# Patient Record
Sex: Male | Born: 1975 | Race: White | Hispanic: No | Marital: Married | Smoking: Never smoker
Health system: Southern US, Community
[De-identification: ages and names within clinical notes are randomized; demographics above are authoritative.]

## PROBLEM LIST (undated history)

## (undated) DIAGNOSIS — I447 Left bundle-branch block, unspecified: Secondary | ICD-10-CM

## (undated) DIAGNOSIS — I493 Ventricular premature depolarization: Secondary | ICD-10-CM

## (undated) DIAGNOSIS — E785 Hyperlipidemia, unspecified: Secondary | ICD-10-CM

## (undated) DIAGNOSIS — R7303 Prediabetes: Secondary | ICD-10-CM

## (undated) DIAGNOSIS — I10 Essential (primary) hypertension: Secondary | ICD-10-CM

## (undated) DIAGNOSIS — T7840XA Allergy, unspecified, initial encounter: Secondary | ICD-10-CM

## (undated) DIAGNOSIS — K219 Gastro-esophageal reflux disease without esophagitis: Secondary | ICD-10-CM

## (undated) DIAGNOSIS — J45909 Unspecified asthma, uncomplicated: Secondary | ICD-10-CM

## (undated) DIAGNOSIS — M87059 Idiopathic aseptic necrosis of unspecified femur: Secondary | ICD-10-CM

## (undated) DIAGNOSIS — F32A Depression, unspecified: Secondary | ICD-10-CM

## (undated) HISTORY — DX: Essential (primary) hypertension: I10

## (undated) HISTORY — PX: KNEE ARTHROSCOPY: SUR90

## (undated) HISTORY — DX: Left bundle-branch block, unspecified: I44.7

## (undated) HISTORY — DX: Hyperlipidemia, unspecified: E78.5

## (undated) HISTORY — DX: Prediabetes: R73.03

## (undated) HISTORY — DX: Depression, unspecified: F32.A

## (undated) HISTORY — DX: Allergy, unspecified, initial encounter: T78.40XA

## (undated) HISTORY — DX: Ventricular premature depolarization: I49.3

---

## 2003-10-04 ENCOUNTER — Emergency Department (HOSPITAL_COMMUNITY): Admission: EM | Admit: 2003-10-04 | Discharge: 2003-10-04 | Payer: Self-pay | Admitting: Emergency Medicine

## 2005-04-24 ENCOUNTER — Emergency Department (HOSPITAL_COMMUNITY): Admission: EM | Admit: 2005-04-24 | Discharge: 2005-04-24 | Payer: Self-pay | Admitting: Family Medicine

## 2005-04-27 ENCOUNTER — Emergency Department (HOSPITAL_COMMUNITY): Admission: EM | Admit: 2005-04-27 | Discharge: 2005-04-27 | Payer: Self-pay | Admitting: *Deleted

## 2010-11-14 ENCOUNTER — Emergency Department (HOSPITAL_COMMUNITY)
Admission: EM | Admit: 2010-11-14 | Discharge: 2010-11-14 | Disposition: A | Discharge status: Home / Self Care | Payer: Commercial Managed Care - PPO | Attending: Emergency Medicine | Admitting: Emergency Medicine

## 2010-11-14 ENCOUNTER — Emergency Department (HOSPITAL_COMMUNITY): Payer: Commercial Managed Care - PPO

## 2010-11-14 DIAGNOSIS — R269 Unspecified abnormalities of gait and mobility: Secondary | ICD-10-CM | POA: Insufficient documentation

## 2010-11-14 DIAGNOSIS — R51 Headache: Secondary | ICD-10-CM | POA: Insufficient documentation

## 2010-11-14 DIAGNOSIS — R110 Nausea: Secondary | ICD-10-CM | POA: Insufficient documentation

## 2010-11-14 DIAGNOSIS — R42 Dizziness and giddiness: Secondary | ICD-10-CM | POA: Insufficient documentation

## 2010-11-14 LAB — POCT I-STAT, CHEM 8
BUN: 17 mg/dL (ref 6–23)
Calcium, Ion: 1.21 mmol/L (ref 1.12–1.32)
Creatinine, Ser: 1 mg/dL (ref 0.50–1.35)
Glucose, Bld: 97 mg/dL (ref 70–99)
HCT: 51 % (ref 39.0–52.0)
Hemoglobin: 17.3 g/dL — ABNORMAL HIGH (ref 13.0–17.0)
Sodium: 139 mEq/L (ref 135–145)
TCO2: 23 mmol/L (ref 0–100)

## 2013-09-22 ENCOUNTER — Other Ambulatory Visit: Payer: Self-pay | Admitting: Physical Medicine and Rehabilitation

## 2013-09-22 DIAGNOSIS — M545 Low back pain, unspecified: Secondary | ICD-10-CM

## 2013-09-22 DIAGNOSIS — M5126 Other intervertebral disc displacement, lumbar region: Secondary | ICD-10-CM

## 2013-09-22 DIAGNOSIS — IMO0002 Reserved for concepts with insufficient information to code with codable children: Secondary | ICD-10-CM

## 2013-09-22 DIAGNOSIS — M5137 Other intervertebral disc degeneration, lumbosacral region: Secondary | ICD-10-CM

## 2013-09-27 ENCOUNTER — Ambulatory Visit
Admission: RE | Admit: 2013-09-27 | Discharge: 2013-09-27 | Disposition: A | Discharge status: Home / Self Care | Payer: Commercial Managed Care - PPO | Source: Ambulatory Visit | Attending: Physical Medicine and Rehabilitation | Admitting: Physical Medicine and Rehabilitation

## 2013-09-27 DIAGNOSIS — IMO0002 Reserved for concepts with insufficient information to code with codable children: Secondary | ICD-10-CM

## 2013-09-27 DIAGNOSIS — M545 Low back pain, unspecified: Secondary | ICD-10-CM

## 2013-09-27 DIAGNOSIS — M5137 Other intervertebral disc degeneration, lumbosacral region: Secondary | ICD-10-CM

## 2013-09-27 DIAGNOSIS — M5126 Other intervertebral disc displacement, lumbar region: Secondary | ICD-10-CM

## 2015-01-12 ENCOUNTER — Other Ambulatory Visit: Payer: Self-pay | Admitting: Family Medicine

## 2015-01-12 DIAGNOSIS — M519 Unspecified thoracic, thoracolumbar and lumbosacral intervertebral disc disorder: Secondary | ICD-10-CM

## 2015-01-12 DIAGNOSIS — M47816 Spondylosis without myelopathy or radiculopathy, lumbar region: Secondary | ICD-10-CM

## 2015-01-16 ENCOUNTER — Ambulatory Visit
Admission: RE | Admit: 2015-01-16 | Discharge: 2015-01-16 | Disposition: A | Discharge status: Home / Self Care | Payer: Commercial Managed Care - PPO | Source: Ambulatory Visit | Attending: Family Medicine | Admitting: Family Medicine

## 2015-01-16 DIAGNOSIS — M47816 Spondylosis without myelopathy or radiculopathy, lumbar region: Secondary | ICD-10-CM

## 2015-01-16 DIAGNOSIS — M519 Unspecified thoracic, thoracolumbar and lumbosacral intervertebral disc disorder: Secondary | ICD-10-CM

## 2015-02-08 ENCOUNTER — Other Ambulatory Visit: Payer: Self-pay | Admitting: Orthopaedic Surgery

## 2015-02-08 DIAGNOSIS — M25551 Pain in right hip: Secondary | ICD-10-CM

## 2015-02-17 ENCOUNTER — Ambulatory Visit
Admission: RE | Admit: 2015-02-17 | Discharge: 2015-02-17 | Disposition: A | Discharge status: Home / Self Care | Payer: BLUE CROSS/BLUE SHIELD | Source: Ambulatory Visit | Attending: Orthopaedic Surgery | Admitting: Orthopaedic Surgery

## 2015-02-17 DIAGNOSIS — M25551 Pain in right hip: Secondary | ICD-10-CM

## 2015-03-22 NOTE — Patient Instructions (Addendum)
Jacob Faulkner  03/22/2015   Your procedure is scheduled ON:[facility] 04/01/2015   Report to [facility] Main  Entrance take [place]  elevators to 3rd floor to  Short Stay Center at   1040  AM.  Call this number if you have problems the morning of surgery [phone]   Remember: ONLY 1 PERSON MAY GO WITH YOU TO SHORT STAY TO GET  READY MORNING OF YOUR SURGERY.   Do not eat food or drink liquids :After Midnight.     Take these medicines the morning of surgery with A SIP OF WATER: use Albuterol inhaler if needed and bring with you morning of surgery                                 You may not have any metal on your body including hair pins and              piercings  Do not wear jewelry, make-up, lotions, powders or perfumes, deodorant             Do not wear nail polish.  Do not shave  48 hours prior to surgery.              Men may shave face and neck.   Do not bring valuables to the hospital. [facility] IS NOT             RESPONSIBLE   FOR VALUABLES.  Contacts, dentures or bridgework may not be worn into surgery.  Leave suitcase in the car. After surgery it may be brought to your room.     Patients discharged the day of surgery will not be allowed to drive home.  Name and phone number of your driver:  Special Instructions: N/A              Please read over the following fact sheets you were given: _____________________________________________________________________             [facility] - Preparing for Surgery Before surgery, you can play an important role.  Because skin is not sterile, your skin needs to be as free of germs as possible.  You can reduce the number of germs on your skin by washing with CHG (chlorahexidine gluconate) soap before surgery.  CHG is an antiseptic cleaner which kills germs and bonds with the skin to continue killing germs even after washing. Please DO NOT use if you have an allergy to CHG or antibacterial soaps.  If your skin  becomes reddened/irritated stop using the CHG and inform your nurse when you arrive at Short Stay. Do not shave (including legs and underarms) for at least 48 hours prior to the first CHG shower.  You may shave your face/neck. Please follow these instructions carefully:  1.  Shower with CHG Soap the night before surgery and the  morning of Surgery.  2.  If you choose to wash your hair, wash your hair first as usual with your  normal  shampoo.  3.  After you shampoo, rinse your hair and body thoroughly to remove the  shampoo.                           4.  Use CHG as you would any other liquid soap.  You can apply chg directly  to the skin and wash                       Gently with a scrungie or clean washcloth.  5.  Apply the CHG Soap to your body ONLY FROM THE NECK DOWN.   Do not use on face/ open                           Wound or open sores. Avoid contact with eyes, ears mouth and genitals (private parts).                       Wash face,  Genitals (private parts) with your normal soap.             6.  Wash thoroughly, paying special attention to the area where your surgery  will be performed.  7.  Thoroughly rinse your body with warm water from the neck down.  8.  DO NOT shower/wash with your normal soap after using and rinsing off  the CHG Soap.                9.  Pat yourself dry with a clean towel.            10.  Wear clean pajamas.            11.  Place clean sheets on your bed the night of your first shower and do not  sleep with pets. Day of Surgery : Do not apply any lotions/deodorants the morning of surgery.  Please wear clean clothes to the hospital/surgery center.  FAILURE TO FOLLOW THESE INSTRUCTIONS MAY RESULT IN THE CANCELLATION OF YOUR SURGERY PATIENT SIGNATURE_________________________________  NURSE SIGNATURE__________________________________  ________________________________________________________________________   Jacob Faulkner  An incentive spirometer is a  tool that can help keep your lungs clear and active. This tool measures how well you are filling your lungs with each breath. Taking long deep breaths may help reverse or decrease the chance of developing breathing (pulmonary) problems (especially infection) following:  A long period of time when you are unable to move or be active. BEFORE THE PROCEDURE   If the spirometer includes an indicator to show your best effort, your nurse or respiratory therapist will set it to a desired goal.  If possible, sit up straight or lean slightly forward. Try not to slouch.  Hold the incentive spirometer in an upright position. INSTRUCTIONS FOR USE   Sit on the edge of your bed if possible, or sit up as far as you can in bed or on a chair.  Hold the incentive spirometer in an upright position.  Breathe out normally.  Place the mouthpiece in your mouth and seal your lips tightly around it.  Breathe in slowly and as deeply as possible, raising the piston or the ball toward the top of the column.  Hold your breath for 3-5 seconds or for as long as possible. Allow the piston or ball to fall to the bottom of the column.  Remove the mouthpiece from your mouth and breathe out normally.  Rest for a few seconds and repeat Steps 1 through 7 at least 10 times every 1-2 hours when you are awake. Take your time and take a few normal breaths between deep breaths.  The spirometer may include an indicator to show your best effort. Use the indicator as a goal to work toward during each repetition.  After each  set of 10 deep breaths, practice coughing to be sure your lungs are clear. If you have an incision (the cut made at the time of surgery), support your incision when coughing by placing a pillow or rolled up towels firmly against it. Once you are able to get out of bed, walk around indoors and cough well. You may stop using the incentive spirometer when instructed by your caregiver.  RISKS AND  COMPLICATIONS  Take your time so you do not get dizzy or light-headed.  If you are in pain, you may need to take or ask for pain medication before doing incentive spirometry. It is harder to take a deep breath if you are having pain. AFTER USE  Rest and breathe slowly and easily.  It can be helpful to keep track of a log of your progress. Your caregiver can provide you with a simple table to help with this. If you are using the spirometer at home, follow these instructions: [place] IF:   You are having difficultly using the spirometer.  You have trouble using the spirometer as often as instructed.  Your pain medication is not giving enough relief while using the spirometer.  You develop fever of 100.5 F (38.1 C) or higher. SEEK IMMEDIATE MEDICAL CARE IF:   You cough up bloody sputum that had not been present before.  You develop fever of 102 F (38.9 C) or greater.  You develop worsening pain at or near the incision site. MAKE SURE YOU:   Understand these instructions.  Will watch your condition.  Will get help right away if you are not doing well or get worse. Document Released: 07/02/2006 Document Revised: 05/14/2011 Document Reviewed: 09/02/2006 ExitCare Patient Information 2014 ExitCare, [place].   ________________________________________________________________________  WHAT IS A BLOOD TRANSFUSION? Blood Transfusion Information  A transfusion is the replacement of blood or some of its parts. Blood is made up of multiple cells which provide different functions.  Red blood cells carry oxygen and are used for blood loss replacement.  White blood cells fight against infection.  Platelets control bleeding.  Plasma helps clot blood.  Other blood products are available for specialized needs, such as hemophilia or other clotting disorders. BEFORE THE TRANSFUSION  Who gives blood for transfusions?   Healthy volunteers who are fully evaluated to make sure  their blood is safe. This is blood bank blood. Transfusion therapy is the safest it has ever been in the practice of medicine. Before blood is taken from a donor, a complete history is taken to make sure that person has no history of diseases nor engages in risky social behavior (examples are intravenous drug use or sexual activity with multiple partners). The donor's travel history is screened to minimize risk of transmitting infections, such as malaria. The donated blood is tested for signs of infectious diseases, such as HIV and hepatitis. The blood is then tested to be sure it is compatible with you in order to minimize the chance of a transfusion reaction. If you or a relative donates blood, this is often done in anticipation of surgery and is not appropriate for emergency situations. It takes many days to process the donated blood. RISKS AND COMPLICATIONS Although transfusion therapy is very safe and saves many lives, the main dangers of transfusion include:   Getting an infectious disease.  Developing a transfusion reaction. This is an allergic reaction to something in the blood you were given. Every precaution is taken to prevent this. The decision to have  a blood transfusion has been considered carefully by your caregiver before blood is given. Blood is not given unless the benefits outweigh the risks. AFTER THE TRANSFUSION  Right after receiving a blood transfusion, you will usually feel much better and more energetic. This is especially true if your red blood cells have gotten low (anemic). The transfusion raises the level of the red blood cells which carry oxygen, and this usually causes an energy increase.  The nurse administering the transfusion will monitor you carefully for complications. HOME CARE INSTRUCTIONS  No special instructions are needed after a transfusion. You may find your energy is better. Speak with your caregiver about any limitations on activity for underlying diseases  you may have. SEEK MEDICAL CARE IF:   Your condition is not improving after your transfusion.  You develop redness or irritation at the intravenous (IV) site. SEEK IMMEDIATE MEDICAL CARE IF:  Any of the following symptoms occur over the next 12 hours:  Shaking chills.  You have a temperature by mouth above 102 F (38.9 C), not controlled by medicine.  Chest, back, or muscle pain.  People around you feel you are not acting correctly or are confused.  Shortness of breath or difficulty breathing.  Dizziness and fainting.  You get a rash or develop hives.  You have a decrease in urine output.  Your urine turns a dark color or changes to pink, red, or brown. Any of the following symptoms occur over the next 10 days:  You have a temperature by mouth above 102 F (38.9 C), not controlled by medicine.  Shortness of breath.  Weakness after normal activity.  The white part of the eye turns yellow (jaundice).  You have a decrease in the amount of urine or are urinating less often.  Your urine turns a dark color or changes to pink, red, or brown. Document Released: 02/17/2000 Document Revised: 05/14/2011 Document Reviewed: 10/06/2007 [facility] Patient Information 2014 [place], [place].  _______________________________________________________________________

## 2015-03-23 ENCOUNTER — Other Ambulatory Visit (HOSPITAL_COMMUNITY): Payer: Self-pay | Admitting: Orthopaedic Surgery

## 2015-03-25 ENCOUNTER — Encounter (HOSPITAL_COMMUNITY)
Admission: RE | Admit: 2015-03-25 | Discharge: 2015-03-25 | Disposition: A | Discharge status: Home / Self Care | Payer: BLUE CROSS/BLUE SHIELD | Source: Ambulatory Visit | Attending: Orthopaedic Surgery | Admitting: Orthopaedic Surgery

## 2015-03-25 ENCOUNTER — Encounter (HOSPITAL_COMMUNITY): Payer: Self-pay

## 2015-03-25 DIAGNOSIS — Z0183 Encounter for blood typing: Secondary | ICD-10-CM | POA: Diagnosis not present

## 2015-03-25 DIAGNOSIS — Z01812 Encounter for preprocedural laboratory examination: Secondary | ICD-10-CM | POA: Insufficient documentation

## 2015-03-25 DIAGNOSIS — M879 Osteonecrosis, unspecified: Secondary | ICD-10-CM | POA: Diagnosis not present

## 2015-03-25 HISTORY — DX: Gastro-esophageal reflux disease without esophagitis: K21.9

## 2015-03-25 HISTORY — DX: Idiopathic aseptic necrosis of unspecified femur: M87.059

## 2015-03-25 HISTORY — DX: Unspecified asthma, uncomplicated: J45.909

## 2015-03-25 LAB — BASIC METABOLIC PANEL
ANION GAP: 8 (ref 5–15)
BUN: 17 mg/dL (ref 6–20)
CHLORIDE: 108 mmol/L (ref 101–111)
CO2: 25 mmol/L (ref 22–32)
Calcium: 9.5 mg/dL (ref 8.9–10.3)
Creatinine, Ser: 0.98 mg/dL (ref 0.61–1.24)
GFR calc Af Amer: >60 mL/min (ref >60)
GLUCOSE: 89 mg/dL (ref 65–99)
POTASSIUM: 4.2 mmol/L (ref 3.5–5.1)
SODIUM: 141 mmol/L (ref 135–145)

## 2015-03-25 LAB — SURGICAL PCR SCREEN
MRSA, PCR: NEGATIVE
Staphylococcus aureus: POSITIVE — AB

## 2015-03-25 LAB — CBC
HEMATOCRIT: 45.4 % (ref 39.0–52.0)
HEMOGLOBIN: 15.1 g/dL (ref 13.0–17.0)
MCH: 28.9 pg (ref 26.0–34.0)
MCHC: 33.3 g/dL (ref 30.0–36.0)
MCV: 86.8 fL (ref 78.0–100.0)
Platelets: 178 10*3/uL (ref 150–400)
RBC: 5.23 MIL/uL (ref 4.22–5.81)
RDW: 12.8 % (ref 11.5–15.5)
WBC: 4.6 10*3/uL (ref 4.0–10.5)

## 2015-03-25 LAB — ABO/RH: ABO/RH(D): A POS

## 2015-04-01 ENCOUNTER — Encounter (HOSPITAL_COMMUNITY): Payer: Self-pay | Admitting: *Deleted

## 2015-04-01 ENCOUNTER — Inpatient Hospital Stay (HOSPITAL_COMMUNITY): Payer: BLUE CROSS/BLUE SHIELD | Admitting: Anesthesiology

## 2015-04-01 ENCOUNTER — Inpatient Hospital Stay (HOSPITAL_COMMUNITY): Payer: BLUE CROSS/BLUE SHIELD

## 2015-04-01 ENCOUNTER — Encounter (HOSPITAL_COMMUNITY)
Admission: RE | Disposition: A | Discharge status: Home Health | Payer: Self-pay | Source: Ambulatory Visit | Attending: Orthopaedic Surgery

## 2015-04-01 ENCOUNTER — Inpatient Hospital Stay (HOSPITAL_COMMUNITY)
Admission: RE | Admit: 2015-04-01 | Discharge: 2015-04-02 | DRG: 470 | Disposition: A | Discharge status: Home Health | Payer: BLUE CROSS/BLUE SHIELD | Source: Ambulatory Visit | Attending: Orthopaedic Surgery | Admitting: Orthopaedic Surgery

## 2015-04-01 DIAGNOSIS — Z87891 Personal history of nicotine dependence: Secondary | ICD-10-CM | POA: Diagnosis not present

## 2015-04-01 DIAGNOSIS — Z01812 Encounter for preprocedural laboratory examination: Secondary | ICD-10-CM | POA: Diagnosis not present

## 2015-04-01 DIAGNOSIS — M25551 Pain in right hip: Secondary | ICD-10-CM | POA: Diagnosis present

## 2015-04-01 DIAGNOSIS — Z419 Encounter for procedure for purposes other than remedying health state, unspecified: Secondary | ICD-10-CM

## 2015-04-01 DIAGNOSIS — Z96641 Presence of right artificial hip joint: Secondary | ICD-10-CM

## 2015-04-01 DIAGNOSIS — M87051 Idiopathic aseptic necrosis of right femur: Secondary | ICD-10-CM | POA: Diagnosis present

## 2015-04-01 DIAGNOSIS — K219 Gastro-esophageal reflux disease without esophagitis: Secondary | ICD-10-CM | POA: Diagnosis present

## 2015-04-01 HISTORY — PX: TOTAL HIP ARTHROPLASTY: SHX124

## 2015-04-01 LAB — TYPE AND SCREEN
ABO/RH(D): A POS
ANTIBODY SCREEN: NEGATIVE

## 2015-04-01 SURGERY — ARTHROPLASTY, HIP, TOTAL, ANTERIOR APPROACH
Anesthesia: General | Site: Hip | Laterality: Right

## 2015-04-01 MED ORDER — FENTANYL CITRATE (PF) 100 MCG/2ML IJ SOLN
INTRAMUSCULAR | Status: DC | PRN
  Administered 2015-04-01: 100 ug via INTRAVENOUS

## 2015-04-01 MED ORDER — METHOCARBAMOL 1000 MG/10ML IJ SOLN
500.0000 mg | Freq: Four times a day (QID) | INTRAMUSCULAR | Status: DC | PRN
  Filled 2015-04-01: qty 5

## 2015-04-01 MED ORDER — CLINDAMYCIN PHOSPHATE 600 MG/50ML IV SOLN
600.0000 mg | Freq: Four times a day (QID) | INTRAVENOUS | Status: AC
  Administered 2015-04-01 – 2015-04-02 (×2): 600 mg via INTRAVENOUS
  Filled 2015-04-01 (×2): qty 50

## 2015-04-01 MED ORDER — DOCUSATE SODIUM 100 MG PO CAPS
100.0000 mg | ORAL_CAPSULE | Freq: Two times a day (BID) | ORAL | Status: DC
  Administered 2015-04-01 – 2015-04-02 (×2): 100 mg via ORAL

## 2015-04-01 MED ORDER — FENTANYL CITRATE (PF) 100 MCG/2ML IJ SOLN
25.0000 ug | INTRAMUSCULAR | Status: DC | PRN
  Administered 2015-04-01 (×2): 50 ug via INTRAVENOUS

## 2015-04-01 MED ORDER — DEXAMETHASONE SODIUM PHOSPHATE 10 MG/ML IJ SOLN
INTRAMUSCULAR | Status: DC | PRN
  Administered 2015-04-01: 10 mg via INTRAVENOUS

## 2015-04-01 MED ORDER — TRANEXAMIC ACID 1000 MG/10ML IV SOLN
1000.0000 mg | INTRAVENOUS | Status: AC
  Administered 2015-04-01: 1000 mg via INTRAVENOUS
  Filled 2015-04-01: qty 10

## 2015-04-01 MED ORDER — ACETAMINOPHEN 650 MG RE SUPP: 650.0000 mg | Freq: Four times a day (QID) | RECTAL | Status: DC | PRN

## 2015-04-01 MED ORDER — HYDROMORPHONE HCL 1 MG/ML IJ SOLN
INTRAMUSCULAR | Status: DC | PRN
  Administered 2015-04-01 (×2): 0.5 mg via INTRAVENOUS
  Administered 2015-04-01: 1 mg via INTRAVENOUS

## 2015-04-01 MED ORDER — ONDANSETRON HCL 4 MG/2ML IJ SOLN: 4.0000 mg | Freq: Four times a day (QID) | INTRAMUSCULAR | Status: DC | PRN

## 2015-04-01 MED ORDER — ONDANSETRON HCL 4 MG/2ML IJ SOLN
INTRAMUSCULAR | Status: DC | PRN
  Administered 2015-04-01: 4 mg via INTRAVENOUS

## 2015-04-01 MED ORDER — HYDROMORPHONE HCL 2 MG/ML IJ SOLN
INTRAMUSCULAR | Status: AC
  Filled 2015-04-01: qty 1

## 2015-04-01 MED ORDER — LACTATED RINGERS IV SOLN
INTRAVENOUS | Status: DC
  Administered 2015-04-01: 1000 mL via INTRAVENOUS
  Administered 2015-04-01: 14:00:00 via INTRAVENOUS

## 2015-04-01 MED ORDER — PANTOPRAZOLE SODIUM 40 MG PO TBEC
40.0000 mg | DELAYED_RELEASE_TABLET | Freq: Every day | ORAL | Status: DC
  Administered 2015-04-01 – 2015-04-02 (×2): 40 mg via ORAL
  Filled 2015-04-01 (×2): qty 1

## 2015-04-01 MED ORDER — METOCLOPRAMIDE HCL 10 MG PO TABS: 5.0000 mg | ORAL_TABLET | Freq: Three times a day (TID) | ORAL | Status: DC | PRN

## 2015-04-01 MED ORDER — STERILE WATER FOR IRRIGATION IR SOLN
Status: DC | PRN
  Administered 2015-04-01: 1000 mL

## 2015-04-01 MED ORDER — OXYCODONE HCL 5 MG PO TABS
5.0000 mg | ORAL_TABLET | ORAL | Status: DC | PRN
  Administered 2015-04-01 – 2015-04-02 (×3): 5 mg via ORAL
  Filled 2015-04-01: qty 1
  Filled 2015-04-01: qty 2
  Filled 2015-04-01: qty 1

## 2015-04-01 MED ORDER — KETOROLAC TROMETHAMINE 15 MG/ML IJ SOLN
7.5000 mg | Freq: Four times a day (QID) | INTRAMUSCULAR | Status: AC
  Administered 2015-04-01 – 2015-04-02 (×4): 7.5 mg via INTRAVENOUS
  Filled 2015-04-01 (×4): qty 1

## 2015-04-01 MED ORDER — ALBUTEROL SULFATE (2.5 MG/3ML) 0.083% IN NEBU: 3.0000 mL | INHALATION_SOLUTION | Freq: Four times a day (QID) | RESPIRATORY_TRACT | Status: DC | PRN

## 2015-04-01 MED ORDER — SODIUM CHLORIDE 0.9 % IV SOLN
INTRAVENOUS | Status: DC
  Administered 2015-04-01: 16:00:00 via INTRAVENOUS

## 2015-04-01 MED ORDER — LIDOCAINE HCL (CARDIAC) 20 MG/ML IV SOLN
INTRAVENOUS | Status: DC | PRN
  Administered 2015-04-01: 50 mg via INTRAVENOUS

## 2015-04-01 MED ORDER — PHENOL 1.4 % MT LIQD
1.0000 | OROMUCOSAL | Status: DC | PRN
  Filled 2015-04-01: qty 177

## 2015-04-01 MED ORDER — METHOCARBAMOL 500 MG PO TABS: 500.0000 mg | ORAL_TABLET | Freq: Four times a day (QID) | ORAL | Status: DC | PRN

## 2015-04-01 MED ORDER — MENTHOL 3 MG MT LOZG: 1.0000 | LOZENGE | OROMUCOSAL | Status: DC | PRN

## 2015-04-01 MED ORDER — CLINDAMYCIN PHOSPHATE 900 MG/50ML IV SOLN
INTRAVENOUS | Status: AC
  Filled 2015-04-01: qty 50

## 2015-04-01 MED ORDER — DEXAMETHASONE SODIUM PHOSPHATE 10 MG/ML IJ SOLN
INTRAMUSCULAR | Status: AC
  Filled 2015-04-01: qty 1

## 2015-04-01 MED ORDER — FENTANYL CITRATE (PF) 100 MCG/2ML IJ SOLN
INTRAMUSCULAR | Status: AC
  Filled 2015-04-01: qty 2

## 2015-04-01 MED ORDER — DIPHENHYDRAMINE HCL 12.5 MG/5ML PO ELIX: 12.5000 mg | ORAL_SOLUTION | ORAL | Status: DC | PRN

## 2015-04-01 MED ORDER — CLINDAMYCIN PHOSPHATE 900 MG/50ML IV SOLN
900.0000 mg | INTRAVENOUS | Status: AC
  Administered 2015-04-01: 900 mg via INTRAVENOUS

## 2015-04-01 MED ORDER — METOCLOPRAMIDE HCL 5 MG/ML IJ SOLN: 5.0000 mg | Freq: Three times a day (TID) | INTRAMUSCULAR | Status: DC | PRN

## 2015-04-01 MED ORDER — SUGAMMADEX SODIUM 200 MG/2ML IV SOLN
INTRAVENOUS | Status: AC
  Filled 2015-04-01: qty 2

## 2015-04-01 MED ORDER — ONDANSETRON HCL 4 MG/2ML IJ SOLN: 4.0000 mg | Freq: Once | INTRAMUSCULAR | Status: DC | PRN

## 2015-04-01 MED ORDER — ALUM & MAG HYDROXIDE-SIMETH 200-200-20 MG/5ML PO SUSP: 30.0000 mL | ORAL | Status: DC | PRN

## 2015-04-01 MED ORDER — SUGAMMADEX SODIUM 200 MG/2ML IV SOLN
INTRAVENOUS | Status: DC | PRN
  Administered 2015-04-01: 200 mg via INTRAVENOUS

## 2015-04-01 MED ORDER — MIDAZOLAM HCL 2 MG/2ML IJ SOLN
INTRAMUSCULAR | Status: AC
  Filled 2015-04-01: qty 2

## 2015-04-01 MED ORDER — PROPOFOL 10 MG/ML IV BOLUS
INTRAVENOUS | Status: AC
  Filled 2015-04-01: qty 20

## 2015-04-01 MED ORDER — LIDOCAINE HCL (CARDIAC) 20 MG/ML IV SOLN
INTRAVENOUS | Status: AC
  Filled 2015-04-01: qty 5

## 2015-04-01 MED ORDER — ONDANSETRON HCL 4 MG PO TABS: 4.0000 mg | ORAL_TABLET | Freq: Four times a day (QID) | ORAL | Status: DC | PRN

## 2015-04-01 MED ORDER — ROCURONIUM BROMIDE 100 MG/10ML IV SOLN
INTRAVENOUS | Status: DC | PRN
Start: 2015-04-01 — End: 2015-04-01
  Administered 2015-04-01: 60 mg via INTRAVENOUS
  Administered 2015-04-01 (×2): 10 mg via INTRAVENOUS

## 2015-04-01 MED ORDER — ACETAMINOPHEN 325 MG PO TABS: 650.0000 mg | ORAL_TABLET | Freq: Four times a day (QID) | ORAL | Status: DC | PRN

## 2015-04-01 MED ORDER — ASPIRIN EC 325 MG PO TBEC
325.0000 mg | DELAYED_RELEASE_TABLET | Freq: Two times a day (BID) | ORAL | Status: DC
  Administered 2015-04-02 (×2): 325 mg via ORAL
  Filled 2015-04-01 (×3): qty 1

## 2015-04-01 MED ORDER — ONDANSETRON HCL 4 MG/2ML IJ SOLN
INTRAMUSCULAR | Status: AC
  Filled 2015-04-01: qty 2

## 2015-04-01 MED ORDER — 0.9 % SODIUM CHLORIDE (POUR BTL) OPTIME
TOPICAL | Status: DC | PRN
  Administered 2015-04-01: 1000 mL

## 2015-04-01 MED ORDER — BUPIVACAINE HCL (PF) 0.5 % IJ SOLN
INTRAMUSCULAR | Status: AC
  Filled 2015-04-01: qty 30

## 2015-04-01 MED ORDER — HYDROMORPHONE HCL 1 MG/ML IJ SOLN
1.0000 mg | INTRAMUSCULAR | Status: DC | PRN
  Administered 2015-04-01: 1 mg via INTRAVENOUS
  Filled 2015-04-01: qty 1

## 2015-04-01 MED ORDER — PROPOFOL 10 MG/ML IV BOLUS
INTRAVENOUS | Status: DC | PRN
  Administered 2015-04-01: 200 mg via INTRAVENOUS

## 2015-04-01 MED ORDER — SODIUM CHLORIDE 0.9 % IR SOLN
Status: DC | PRN
  Administered 2015-04-01: 1000 mL

## 2015-04-01 MED ORDER — MIDAZOLAM HCL 5 MG/5ML IJ SOLN
INTRAMUSCULAR | Status: DC | PRN
  Administered 2015-04-01: 2 mg via INTRAVENOUS

## 2015-04-01 MED ORDER — ZOLPIDEM TARTRATE 5 MG PO TABS: 5.0000 mg | ORAL_TABLET | Freq: Every evening | ORAL | Status: DC | PRN

## 2015-04-01 SURGICAL SUPPLY — 38 items
APL SKNCLS STERI-STRIP NONHPOA (GAUZE/BANDAGES/DRESSINGS) ×1
BAG SPEC THK2 15X12 ZIP CLS (MISCELLANEOUS) ×1
BAG ZIPLOCK 12X15 (MISCELLANEOUS) ×1 IMPLANT
BENZOIN TINCTURE PRP APPL 2/3 (GAUZE/BANDAGES/DRESSINGS) ×1 IMPLANT
BLADE SAW SGTL 18X1.27X75 (BLADE) ×2 IMPLANT
CAPT HIP TOTAL 2 ×1 IMPLANT
CELLS DAT CNTRL 66122 CELL SVR (MISCELLANEOUS) ×1 IMPLANT
CLOTH BEACON ORANGE TIMEOUT ST (SAFETY) ×2 IMPLANT
DRAPE STERI IOBAN 125X83 (DRAPES) ×2 IMPLANT
DRAPE U-SHAPE 47X51 STRL (DRAPES) ×4 IMPLANT
DRSG AQUACEL AG ADV 3.5X10 (GAUZE/BANDAGES/DRESSINGS) ×2 IMPLANT
DURAPREP 26ML APPLICATOR (WOUND CARE) ×2 IMPLANT
ELECT REM PT RETURN 9FT ADLT (ELECTROSURGICAL) ×2
ELECTRODE REM PT RTRN 9FT ADLT (ELECTROSURGICAL) ×1 IMPLANT
FACESHIELD WRAPAROUND (MASK) ×2 IMPLANT
FACESHIELD WRAPAROUND OR TEAM (MASK) IMPLANT
GAUZE XEROFORM 1X8 LF (GAUZE/BANDAGES/DRESSINGS) IMPLANT
GLOVE BIO SURGEON STRL SZ7.5 (GLOVE) ×2 IMPLANT
GLOVE BIOGEL PI IND STRL 8 (GLOVE) ×2 IMPLANT
GLOVE BIOGEL PI INDICATOR 8 (GLOVE) ×2
GLOVE ECLIPSE 8.0 STRL XLNG CF (GLOVE) ×2 IMPLANT
GOWN STRL REUS W/TWL XL LVL3 (GOWN DISPOSABLE) ×4 IMPLANT
HANDPIECE INTERPULSE COAX TIP (DISPOSABLE) ×2
HOLDER FOLEY CATH W/STRAP (MISCELLANEOUS) ×2 IMPLANT
PACK ANTERIOR HIP CUSTOM (KITS) ×2 IMPLANT
RETRACTOR WND ALEXIS 18 MED (MISCELLANEOUS) ×1 IMPLANT
RTRCTR WOUND ALEXIS 18CM MED (MISCELLANEOUS) ×2
SET HNDPC FAN SPRY TIP SCT (DISPOSABLE) ×1 IMPLANT
STAPLER VISISTAT 35W (STAPLE) IMPLANT
STRIP CLOSURE SKIN 1/2X4 (GAUZE/BANDAGES/DRESSINGS) ×1 IMPLANT
SUT ETHIBOND NAB CT1 #1 30IN (SUTURE) ×2 IMPLANT
SUT MNCRL AB 4-0 PS2 18 (SUTURE) IMPLANT
SUT VIC AB 0 CT1 36 (SUTURE) ×2 IMPLANT
SUT VIC AB 1 CT1 36 (SUTURE) ×2 IMPLANT
SUT VIC AB 2-0 CT1 27 (SUTURE) ×4
SUT VIC AB 2-0 CT1 TAPERPNT 27 (SUTURE) ×2 IMPLANT
TRAY FOLEY W/METER SILVER 14FR (SET/KITS/TRAYS/PACK) ×1 IMPLANT
TRAY FOLEY W/METER SILVER 16FR (SET/KITS/TRAYS/PACK) ×2 IMPLANT

## 2015-04-01 NOTE — Anesthesia Postprocedure Evaluation (Signed)
Anesthesia Post Note  Patient: Jacob Faulkner  Procedure(s) Performed: Procedure(s) (LRB): RIGHT TOTAL HIP ARTHROPLASTY ANTERIOR APPROACH (Right)  Patient location during evaluation: PACU Anesthesia Type: General Level of consciousness: awake and alert Pain management: pain level controlled Vital Signs Assessment: post-procedure vital signs reviewed and stable Respiratory status: spontaneous breathing, nonlabored ventilation, respiratory function stable and patient connected to nasal cannula oxygen Cardiovascular status: blood pressure returned to baseline and stable Postop Assessment: no signs of nausea or vomiting Anesthetic complications: no    Last Vitals:  Filed Vitals:   04/01/15 1650 04/01/15 1750  BP: 134/78 136/88  Pulse: 86 92  Temp: 36.7 C 36.8 C  Resp: 18 18    Last Pain:  Filed Vitals:   04/01/15 1751  PainSc: 3                  Kadelyn Dimascio JENNETTE

## 2015-04-01 NOTE — Brief Op Note (Signed)
04/01/2015  2:13 PM  PATIENT:  Jacob Faulkner  40 y.o. male  PRE-OPERATIVE DIAGNOSIS:  avascular necrosis right hip  POST-OPERATIVE DIAGNOSIS:  avascular necrosis right hip  PROCEDURE:  Procedure(s): RIGHT TOTAL HIP ARTHROPLASTY ANTERIOR APPROACH (Right)  SURGEON:  Surgeon(s) and Role:    * Kathryne Hitch, MD - Primary  PHYSICIAN ASSISTANT: Rexene Edison, PA-C  ANESTHESIA:   general  EBL:  Total I/O In: 1000 [I.V.:1000] Out: 450 [Urine:150; Blood:300]  BLOOD ADMINISTERED:none  SPECIMEN:  Source of Specimen:  right femoral head  DISPOSITION OF SPECIMEN:  PATHOLOGY  COUNTS:  YES  TOURNIQUET:  * No tourniquets in log *  DICTATION: .Other Dictation: Dictation Number [id number]  PLAN OF CARE: Admit to inpatient   PATIENT DISPOSITION:  PACU - hemodynamically stable.   Delay start of Pharmacological VTE agent (>24hrs) due to surgical blood loss or risk of bleeding: no

## 2015-04-01 NOTE — H&P (Signed)
TOTAL HIP ADMISSION H&P  Patient is admitted for right total hip arthroplasty.  Subjective:  Chief Complaint: right hip pain  HPI: Jacob Faulkner, 40 y.o. male, has a history of pain and functional disability in the right hip(s) due to avascualr necrosis and patient has failed non-surgical conservative treatments for greater than 12 weeks to include NSAID's and/or analgesics and activity modification.  Onset of symptoms was abrupt starting 1 years ago with rapidlly worsening course since that time.The patient noted no past surgery on the right hip(s).  Patient currently rates pain in the right hip at 10 out of 10 with activity. Patient has worsening of pain with activity and weight bearing, pain that interfers with activities of daily living and pain with passive range of motion. Patient has evidence of subchondral cysts and bone marrow edema consistent with AVN by imaging studies. This condition presents safety issues increasing the risk of falls.  There is no current active infection.  Patient Active Problem List   Diagnosis Date Noted  . Avascular necrosis of bone of right hip (HCC) 04/01/2015   Past Medical History  Diagnosis Date  . Asthma   . GERD (gastroesophageal reflux disease)   . Avascular necrosis of hip (HCC)     right hip    Past Surgical History  Procedure Laterality Date  . Knee arthroscopy      left    No prescriptions prior to admission   Allergies  Allergen Reactions  . Omnicef [Cefdinir] Hives  . Penicillins Hives    Has patient had a PCN reaction causing immediate rash, facial/tongue/throat swelling, SOB or lightheadedness with hypotension: No Has patient had a PCN reaction causing severe rash involving mucus membranes or skin necrosis: No Has patient had a PCN reaction that required hospitalization No Has patient had a PCN reaction occurring within the last 10 years: No If all of the above answers are "NO", then may proceed with Cephalosporin use.    Social  History  Substance Use Topics  . Smoking status: Former Games developer  . Smokeless tobacco: Not on file  . Alcohol Use: No    No family history on file.   Review of Systems  Musculoskeletal: Positive for joint pain.  All other systems reviewed and are negative.   Objective:  Physical Exam  Constitutional: He is oriented to person, place, and time. He appears well-developed and well-nourished.  HENT:  Head: Normocephalic and atraumatic.  Eyes: EOM are normal. Pupils are equal, round, and reactive to light.  Neck: Normal range of motion. Neck supple.  Cardiovascular: Normal rate and regular rhythm.   Respiratory: Effort normal and breath sounds normal.  GI: Soft. Bowel sounds are normal.  Musculoskeletal:       Right hip: He exhibits decreased range of motion, decreased strength, tenderness and bony tenderness.  Neurological: He is alert and oriented to person, place, and time.  Skin: Skin is warm and dry.  Psychiatric: He has a normal mood and affect.    Vital signs in last 24 hours:    Labs:   There is no height on file to calculate BMI.   Imaging Review Plain radiographs and MRI demonstrate advanced avascular necrosis of the right hip Assessment/Plan:  AVN, right hip(s)  The patient history, physical examination, clinical judgement of the provider and imaging studies are consistent with end stage avascular necrosis of the right hip(s) and total hip arthroplasty is deemed medically necessary. The treatment options including medical management, injection therapy, arthroscopy and arthroplasty were  discussed at length. The risks and benefits of total hip arthroplasty were presented and reviewed. The risks due to aseptic loosening, infection, stiffness, dislocation/subluxation,  thromboembolic complications and other imponderables were discussed.  The patient acknowledged the explanation, agreed to proceed with the plan and consent was signed. Patient is being admitted for  inpatient treatment for surgery, pain control, PT, OT, prophylactic antibiotics, VTE prophylaxis, progressive ambulation and ADL's and discharge planning.The patient is planning to be discharged home with home health services

## 2015-04-01 NOTE — Anesthesia Preprocedure Evaluation (Addendum)
Anesthesia Evaluation  Patient identified by MRN, date of birth, ID band Patient awake    Reviewed: Allergy & Precautions, NPO status , Patient's Chart, lab work & pertinent test results  History of Anesthesia Complications Negative for: history of anesthetic complications  Airway Mallampati: II  TM Distance: >3 FB Neck ROM: Full    Dental no notable dental hx. (+) Dental Advisory Given   Pulmonary asthma , former smoker,    Pulmonary exam normal breath sounds clear to auscultation       Cardiovascular negative cardio ROS Normal cardiovascular exam Rhythm:Regular Rate:Normal     Neuro/Psych negative neurological ROS  negative psych ROS   GI/Hepatic Neg liver ROS, GERD  Medicated and Controlled,  Endo/Other  negative endocrine ROS  Renal/GU negative Renal ROS  negative genitourinary   Musculoskeletal negative musculoskeletal ROS (+)   Abdominal   Peds negative pediatric ROS (+)  Hematology negative hematology ROS (+)   Anesthesia Other Findings   Reproductive/Obstetrics negative OB ROS                            Anesthesia Physical Anesthesia Plan  ASA: II  Anesthesia Plan: General   Post-op Pain Management:    Induction: Intravenous  Airway Management Planned: Oral ETT  Additional Equipment:   Intra-op Plan:   Post-operative Plan: Extubation in OR  Informed Consent: I have reviewed the patients History and Physical, chart, labs and discussed the procedure including the risks, benefits and alternatives for the proposed anesthesia with the patient or authorized representative who has indicated his/her understanding and acceptance.   Dental advisory given  Plan Discussed with: CRNA  Anesthesia Plan Comments:        Anesthesia Quick Evaluation

## 2015-04-01 NOTE — Transfer of Care (Signed)
Immediate Anesthesia Transfer of Care Note  Patient: Jacob Faulkner  Procedure(s) Performed: Procedure(s): RIGHT TOTAL HIP ARTHROPLASTY ANTERIOR APPROACH (Right)  Patient Location: PACU  Anesthesia Type:General  Level of Consciousness: awake, alert  and oriented  Airway & Oxygen Therapy: Patient Spontanous Breathing and Patient connected to face mask oxygen  Post-op Assessment: Report given to RN and Post -op Vital signs reviewed and stable  Post vital signs: Reviewed and stable  Last Vitals:  Filed Vitals:   04/01/15 1047  BP: 136/95  Pulse: 72  Temp: 36.4 C  Resp: 18    Complications: No apparent anesthesia complications

## 2015-04-01 NOTE — Op Note (Signed)
Jacob Faulkner, Jacob Faulkner                ACCOUNT NO.:  [id number]  MEDICAL RECORD NO.:  [id number]  LOCATION:  [facility]                         FACILITY:  [facility]  PHYSICIAN:  Vanita Panda. Magnus Ivan, M.D.DATE OF BIRTH:  1975-12-26  DATE OF PROCEDURE:  04/01/2015 DATE OF DISCHARGE:                              OPERATIVE REPORT   PREOPERATIVE DIAGNOSIS:  Right hip stage III avascular necrosis.  POSTOPERATIVE DIAGNOSIS:  Right hip stage III avascular necrosis.  PROCEDURE:  Right total hip arthroplasty through direct anterior approach.  IMPLANTS:  DePuy Sector Gription acetabular component size 52, size 36 +4 neutral polyethylene liner, size 13 Corail femoral component with standard offset, size 36 +1.5 ceramic hip ball.  SURGEON:  Vanita Panda. Magnus Ivan, M.D.  ASSISTANT:  Richardean Canal, PA-C.  ANESTHESIA:  General.  ANTIBIOTICS:  900 mg IV clindamycin.  BLOOD LOSS:  250 mL.  COMPLICATIONS:  None.  INDICATIONS:  Jacob Faulkner is a 40 year old gentleman, well known to me.  He had been known to me for many years now.  Over the last year, he started developing debilitating right hip pain.  He had a lot of stiffness in internal and external rotation, but normal plain films.  Due to the continued pain with loading times of activities, we obtain an MRI of his right hip and surprisingly it showed significant avascular necrosis of the femoral head.  There were no areas of collapse, but there was significant edema in the femoral head and the cystic changes.  They were subcortical and in a wide extent of the area.  At this point, his options were limited.  A cord decompression would not work at this point, that he should consider total hip replacement surgery if he got to the point where it is affecting his activities of daily living, his mobility, and quality of life.  He is also going to consider free vascularized fibular graft.  He did do some research on his own and decided that he wanted to  proceed with a total hip arthroplasty.  I explained the risk of acute blood loss anemia, nerve and vessel injury, fracture, infection, dislocation, DVT as well as generalized wear considering his young age and high level of function.  He understands our goals are decreased pain, improved mobility, and overall improved quality of life.  PROCEDURE DESCRIPTION:  After informed consent was obtained, appropriate right hip was marked.  He was brought to the operating room and general anesthesia was obtained while he was on the stretcher.  A Foley catheter was placed and then both feet had traction boots applied to them.  Next, he was placed supine on the Hana fracture table with perineal post in place and both legs inline skeletal traction devices, but no traction applied.  His right operative hip was then prepped and draped with DuraPrep and sterile drapes.  Time-out was called to identify the correct patient, correct right hip.  We then made an incision inferior and posterior to the anterior-superior iliac spine and carried this obliquely down the leg.  We dissected down the tensor fascia lata muscle.  The tensor fascia was then divided longitudinally, so we could proceed with a direct anterior approach to the  hip.  We identified and cauterized the lateral femoral circumflex vessels and then identified the hip capsule.  We cauterized the lateral femoral circumflex vessels as well.  We then put retractors around the medial and lateral femoral neck and opened up the hip capsule in L-type format finding a moderate joint effusion.  We then placed Cobra retractors within the hip capsule, made our femoral neck cut with an oscillating saw proximal to the lesser trochanter and completed this with an osteotome.  We placed a corkscrew guide in the femoral head, removed femoral heads in its entirety and found some areas of fibrocartilage.  We still passed off the table to Pathology for identifying  whether or not there is truly avascular necrosis.  We then proceeded with the anterior hip replacement.  We placed a Bent Hohmann over the medial acetabular rim and released the transverse acetabular ligament.  I cleaned the remnants of acetabular labrum from the acetabulum.  I then began reaming under direct visualization from a size 42 reamer up to a size 51 with all reamers under direct visualization, the last reamer under direct fluoroscopy, so we could obtain our depth of reaming, our inclination, and anteversion. Once we were pleased with this, I placed the real DePuy Sector Gription acetabular component size 52 and a 36 +4 polyethylene liner for that size of acetabular component.  Attention was then turned to the femur with the leg externally rotated to 100 degrees, extended and adducted. We were able to use a Mueller retractor medially and a Hohmann retractor behind the greater trochanter.  I released the lateral joint capsule, used a box cutting osteotome in the inner femoral canal and a rongeur lateralized.  We then began broaching from a size 8 broach using the Corail broaching system up to a size 13.  With the size 13, we used a calcar planer and then placed a standard femoral neck and a 36 +1.5 trial hip ball.  We brought the leg back over and up with traction, internal rotation.  We were pleased with his rotation offset and leg lengths under fluoroscopy.  We then dislocated the hip and removed the trial components.  We then placed the real Corail femoral component size 13 and the real 36 +1.5 ceramic hip ball, was also a standard offset femoral neck.  We reduced this back in the acetabulum.  We were again pleased with stability.  We then irrigated the soft tissue with normal saline solution using pulsatile lavage.  We closed the joint capsule with interrupted #1 Ethibond suture followed by running #1 Vicryl in the tensor fascia, 0 Vicryl in deep tissue, 2-0 Vicryl in the  subcutaneous tissue, 4-0 Monocryl with subcuticular stitch, and Steri-Strips on the skin.  An Aquacel dressing was applied.  He was taken off the Hana table, awakened, extubated, and taken to the recovery room in stable condition.  All final counts were correct.  There were no complications noted.  Of note, Richardean Canal, PA-C assisted in the entire case.  His assistance was crucial for facilitating all aspects of this case.     Vanita Panda. Magnus Ivan, M.D.     CYB/MEDQ  D:  04/01/2015  T:  04/01/2015  Job:  [id number]

## 2015-04-01 NOTE — Anesthesia Procedure Notes (Signed)
Procedure Name: Intubation Date/Time: 04/01/2015 12:47 PM Performed by: Enriqueta Shutter D Pre-anesthesia Checklist: Patient identified, Emergency Drugs available, Suction available and Patient being monitored Patient Re-evaluated:Patient Re-evaluated prior to inductionOxygen Delivery Method: Circle System Utilized Preoxygenation: Pre-oxygenation with 100% oxygen Intubation Type: IV induction Ventilation: Mask ventilation without difficulty Laryngoscope Size: Mac and 4 Grade View: Grade I Tube type: Oral Tube size: 7.5 mm Number of attempts: 1 Airway Equipment and Method: Stylet and Oral airway Placement Confirmation: ETT inserted through vocal cords under direct vision,  positive ETCO2 and breath sounds checked- equal and bilateral Secured at: 22 cm Tube secured with: Tape Dental Injury: Teeth and Oropharynx as per pre-operative assessment

## 2015-04-02 ENCOUNTER — Encounter (HOSPITAL_COMMUNITY): Payer: Self-pay | Admitting: *Deleted

## 2015-04-02 LAB — CBC
HCT: 38.2 % — ABNORMAL LOW (ref 39.0–52.0)
HEMOGLOBIN: 12.8 g/dL — AB (ref 13.0–17.0)
MCH: 29 pg (ref 26.0–34.0)
MCHC: 33.5 g/dL (ref 30.0–36.0)
MCV: 86.6 fL (ref 78.0–100.0)
Platelets: 175 10*3/uL (ref 150–400)
RBC: 4.41 MIL/uL (ref 4.22–5.81)
RDW: 12.9 % (ref 11.5–15.5)
WBC: 11.5 10*3/uL — ABNORMAL HIGH (ref 4.0–10.5)

## 2015-04-02 LAB — BASIC METABOLIC PANEL
ANION GAP: 8 (ref 5–15)
BUN: 15 mg/dL (ref 6–20)
CHLORIDE: 105 mmol/L (ref 101–111)
CO2: 25 mmol/L (ref 22–32)
Calcium: 8.5 mg/dL — ABNORMAL LOW (ref 8.9–10.3)
Creatinine, Ser: 0.94 mg/dL (ref 0.61–1.24)
GFR calc non Af Amer: >60 mL/min (ref >60)
Glucose, Bld: 136 mg/dL — ABNORMAL HIGH (ref 65–99)
POTASSIUM: 4.2 mmol/L (ref 3.5–5.1)
SODIUM: 138 mmol/L (ref 135–145)

## 2015-04-02 MED ORDER — OXYCODONE HCL 5 MG PO TABS: 5.0000 mg | ORAL_TABLET | ORAL | Status: DC | PRN

## 2015-04-02 MED ORDER — ASPIRIN 325 MG PO TBEC: 325.0000 mg | DELAYED_RELEASE_TABLET | Freq: Two times a day (BID) | ORAL | Status: DC

## 2015-04-02 MED ORDER — METHOCARBAMOL 500 MG PO TABS: 500.0000 mg | ORAL_TABLET | Freq: Four times a day (QID) | ORAL | Status: DC | PRN

## 2015-04-02 NOTE — Discharge Summary (Signed)
Patient ID: Jacob Faulkner MRN: [id number] DOB/AGE: 09/27/1975 40 y.o.  Admit date: 04/01/2015 Discharge date: 04/02/2015  Admission Diagnoses:  Principal Problem:   Avascular necrosis of bone of right hip [facility]) Active Problems:   Status post total replacement of right hip   Discharge Diagnoses:  Same  Past Medical History  Diagnosis Date  . Asthma   . GERD (gastroesophageal reflux disease)   . Avascular necrosis of hip (HCC)     right hip    Surgeries: Procedure(s): RIGHT TOTAL HIP ARTHROPLASTY ANTERIOR APPROACH on 04/01/2015   Consultants:    Discharged Condition: Improved  Hospital Course: Quintus Premo is an 40 y.o. male who was admitted 04/01/2015 for operative treatment ofAvascular necrosis of bone of right hip (HCC). Patient has severe unremitting pain that affects sleep, daily activities, and work/hobbies. After pre-op clearance the patient was taken to the operating room on 04/01/2015 and underwent  Procedure(s): RIGHT TOTAL HIP ARTHROPLASTY ANTERIOR APPROACH.    Patient was given perioperative antibiotics: Anti-infectives    Start     Dose/Rate Route Frequency Ordered Stop   04/01/15 2000  clindamycin (CLEOCIN) IVPB 600 mg     600 mg 100 mL/hr over 30 Minutes Intravenous Every 6 hours 04/01/15 1545 04/02/15 0151   04/01/15 1047  clindamycin (CLEOCIN) IVPB 900 mg     900 mg 100 mL/hr over 30 Minutes Intravenous On call to O.R. 04/01/15 1047 04/01/15 1248       Patient was given sequential compression devices, early ambulation, and chemoprophylaxis to prevent DVT.  Patient benefited maximally from hospital stay and there were no complications.    Recent vital signs: Patient Vitals for the past 24 hrs:  BP Temp Temp src Pulse Resp SpO2  04/02/15 1319 (!) 142/60 mmHg 98.8 F (37.1 C) Oral (!) 106 16 -  04/02/15 1030 123/70 mmHg 98.2 F (36.8 C) Oral 87 16 100 %  04/02/15 0513 123/63 mmHg 98.6 F (37 C) Oral 76 16 94 %  04/02/15 0115 128/72 mmHg 98.6 F (37 C)  Oral 77 16 98 %  04/01/15 2057 136/85 mmHg 98.5 F (36.9 C) Oral (!) 111 16 100 %  04/01/15 1850 116/72 mmHg 97.6 F (36.4 C) Oral 94 18 96 %  04/01/15 1750 136/88 mmHg 98.3 F (36.8 C) Oral 92 18 100 %  04/01/15 1650 134/78 mmHg 98 F (36.7 C) Oral 86 18 99 %  04/01/15 1538 137/76 mmHg 98.7 F (37.1 C) - - 16 100 %     Recent laboratory studies:  Recent Labs  04/02/15 0449  WBC 11.5*  HGB 12.8*  HCT 38.2*  PLT 175  NA 138  K 4.2  CL 105  CO2 25  BUN 15  CREATININE 0.94  GLUCOSE 136*  CALCIUM 8.5*     Discharge Medications:     Medication List    TAKE these medications        albuterol 108 (90 Base) MCG/ACT inhaler  Commonly known as:  PROVENTIL HFA;VENTOLIN HFA  Inhale 1 puff into the lungs every 6 (six) hours as needed for wheezing or shortness of breath.     aspirin 325 MG EC tablet  Take 1 tablet (325 mg total) by mouth 2 (two) times daily after a meal.     methocarbamol 500 MG tablet  Commonly known as:  ROBAXIN  Take 1 tablet (500 mg total) by mouth every 6 (six) hours as needed for muscle spasms.     oxyCODONE 5 MG immediate release tablet  Commonly known as:  Oxy IR/ROXICODONE  Take 1-2 tablets (5-10 mg total) by mouth every 3 (three) hours as needed for breakthrough pain.        Diagnostic Studies: Dg C-arm 1-60 Min-no Report  04/01/2015  CLINICAL DATA: right hip surgery C-ARM 1-60 MINUTES Fluoroscopy was utilized by the requesting physician.  No radiographic interpretation.   Dg Hip Port Unilat With Pelvis 1v Right  04/01/2015  CLINICAL DATA:  Status post right total hip arthroplasty. EXAM: DG HIP (WITH OR WITHOUT PELVIS) 1V PORT RIGHT COMPARISON:  Same day. FINDINGS: The femoral and acetabular components appear to be well situated. No dislocation is noted. Expected postoperative changes are seen in the surrounding soft tissues. IMPRESSION: Status post right total hip arthroplasty. Electronically Signed   By: Lupita Raider, M.D.   On: 04/01/2015  15:23   Dg Hip Operative Unilat With Pelvis Right  04/01/2015  CLINICAL DATA:  Rt total hip replacement   .4 min fluoro EXAM: OPERATIVE RIGHT HIP (WITH PELVIS IF PERFORMED) 2 VIEWS TECHNIQUE: Fluoroscopic spot image(s) were submitted for interpretation post-operatively. COMPARISON:  MRI 02/17/2015 FINDINGS: The patient has undergone right hip arthroplasty. No evidence for dislocation on the frontal views provided. No acute fracture. IMPRESSION: Status post total hip arthroplasty.  No adverse features. Electronically Signed   By: Norva Pavlov M.D.   On: 04/01/2015 14:16    Disposition: 01-Home or Self Care      Discharge Instructions    Discharge patient    Complete by:  As directed            Follow-up Information    Follow up with Kathryne Hitch, MD In 2 weeks.   Specialty:  Orthopedic Surgery   Contact information:   [address] [place] [address] [phone]        Signed: Kathryne Hitch 04/02/2015, 3:31 PM

## 2015-04-02 NOTE — Progress Notes (Signed)
Physical Therapy Treatment Patient Details Name: Jacob Faulkner MRN: [id number] DOB: November 29, 1975 Today's Date: 04/02/2015    History of Present Illness Pt s/p R THR 2* AVN    PT Comments    Pt motivated, progressing well with mobility and hopeful for dc tomorrow.  Follow Up Recommendations  Home health PT     Equipment Recommendations  Rolling walker with 5" wheels    Recommendations for Other Services OT consult     Precautions / Restrictions Precautions Precautions: Fall Restrictions Weight Bearing Restrictions: No Other Position/Activity Restrictions: WBAT    Mobility  Bed Mobility Overal bed mobility: Needs Assistance Bed Mobility: Supine to Sit;Sit to Supine     Supine to sit: Min guard Sit to supine: Min guard   General bed mobility comments: pt self assisting R LE with L LE  Transfers Overall transfer level: Needs assistance Equipment used: Rolling walker (2 wheeled) Transfers: Sit to/from Stand Sit to Stand: Supervision Stand pivot transfers: Supervision       General transfer comment: VC for safety  Ambulation/Gait Ambulation/Gait assistance: Min guard;Supervision Ambulation Distance (Feet): 320 Feet Assistive device: Rolling walker (2 wheeled) Gait Pattern/deviations: Step-to pattern;Step-through pattern;Decreased step length - right;Decreased step length - left;Shuffle;Trunk flexed Gait velocity: decr   General Gait Details: cues for posture, position from RW, initial sequence and to correct IR on R   Stairs Stairs: Yes Stairs assistance: Min assist Stair Management: One rail Right;Forwards;With cane;Step to pattern Number of Stairs: 8 General stair comments: 4 stairs fwd with cane and rail; 4 stairs with both hands on R rail.  Cues for sequence and foot/cane placement  Wheelchair Mobility    Modified Rankin (Stroke Patients Only)       Balance                                    Cognition Arousal/Alertness:  Awake/alert Behavior During Therapy: WFL for tasks assessed/performed Overall Cognitive Status: Within Functional Limits for tasks assessed                      Exercises Total Joint Exercises Ankle Circles/Pumps: AROM;Both;15 reps;Supine Quad Sets: AROM;Both;10 reps;Supine Heel Slides: AAROM;Right;20 reps;Supine Hip ABduction/ADduction: AAROM;Right;15 reps;Supine    General Comments        Pertinent Vitals/Pain Pain Assessment: 0-10 Pain Score: 3  Pain Location: R hip Pain Descriptors / Indicators: Tightness;Sore Pain Intervention(s): Limited activity within patient's tolerance;Monitored during session;Ice applied    Home Living Family/patient expects to be discharged to:: Private residence Living Arrangements: Spouse/significant other;Children Available Help at Discharge: Family Type of Home: House Home Access: Stairs to enter Entrance Stairs-Rails: Right Home Layout: One level Home Equipment: None      Prior Function Level of Independence: Independent          PT Goals (current goals can now be found in the care plan section) Acute Rehab PT Goals Patient Stated Goal: Regain IND and resume previous active lifestyle  PT Goal Formulation: With patient Time For Goal Achievement: 04/05/15 Potential to Achieve Goals: Good Progress towards PT goals: Progressing toward goals    Frequency  7X/week    PT Plan Current plan remains appropriate    Co-evaluation             End of Session Equipment Utilized During Treatment: Gait belt Activity Tolerance: Patient tolerated treatment well Patient left: in bed;with call bell/phone within reach;with family/visitor present  Time: 5784-6962 PT Time Calculation (min) (ACUTE ONLY): 25 min  Charges:  $Gait Training: 8-22 mins $Therapeutic Exercise: 8-22 mins                    G Codes:      Jacob Faulkner 04/21/15, 3:09 PM

## 2015-04-02 NOTE — Progress Notes (Signed)
Discharged from floor via w/c, belongings & wife with pt. No changes in assessment. Cathrine Krizan  

## 2015-04-02 NOTE — Progress Notes (Signed)
Patient ID: Jacob Faulkner, male   DOB: 1975-09-06, 40 y.o.   MRN: [id number] He is doing very well and PT also states that he made great progress today.  Can be discharged to home today.

## 2015-04-02 NOTE — Evaluation (Signed)
Physical Therapy Evaluation Patient Details Name: Jacob Faulkner MRN: [id number] DOB: Mar 20, 1975 Today's Date: 04/02/2015   History of Present Illness  Pt s/p R THR 2* AVN  Clinical Impression  Pt s/p R THR presents with decreased R LE strength/ROM and post op pain limiting functional mobility.  Pt should progress well to dc home with family assist and HHPT follow up.    Follow Up Recommendations Home health PT    Equipment Recommendations  Rolling walker with 5" wheels    Recommendations for Other Services OT consult     Precautions / Restrictions Precautions Precautions: Fall Restrictions Weight Bearing Restrictions: No Other Position/Activity Restrictions: WBAT      Mobility  Bed Mobility Overal bed mobility: Needs Assistance Bed Mobility: Supine to Sit;Sit to Supine     Supine to sit: Min assist Sit to supine: Min assist   General bed mobility comments: cues for sequence and use of L LE to self assist  Transfers Overall transfer level: Needs assistance Equipment used: Rolling walker (2 wheeled) Transfers: Sit to/from Stand Sit to Stand: Min assist         General transfer comment: 200  Ambulation/Gait Ambulation/Gait assistance: Min assist;Min guard Ambulation Distance (Feet): 200 Feet Assistive device: Rolling walker (2 wheeled) Gait Pattern/deviations: Step-to pattern;Step-through pattern;Decreased step length - right;Decreased step length - left;Shuffle;Trunk flexed Gait velocity: decr Gait velocity interpretation: Below normal speed for age/gender General Gait Details: cues for posture, position from RW, initial sequence and to correct IR on R  Stairs            Wheelchair Mobility    Modified Rankin (Stroke Patients Only)       Balance                                             Pertinent Vitals/Pain Pain Assessment: 0-10 Pain Score: 3  Pain Location: R hip Pain Descriptors / Indicators: Aching;Sore Pain  Intervention(s): Limited activity within patient's tolerance;Monitored during session;Premedicated before session;Ice applied    Home Living Family/patient expects to be discharged to:: Private residence Living Arrangements: Spouse/significant other;Children Available Help at Discharge: Family Type of Home: House Home Access: Stairs to enter Entrance Stairs-Rails: Right Entrance Stairs-Number of Steps: 4 Home Layout: One level Home Equipment: None      Prior Function Level of Independence: Independent               Hand Dominance        Extremity/Trunk Assessment   Upper Extremity Assessment: Overall WFL for tasks assessed           Lower Extremity Assessment: RLE deficits/detail RLE Deficits / Details: Strength at hip 2+/5 with AAROM at hip to 90 flex and 20 abd    Cervical / Trunk Assessment: Normal  Communication   Communication: No difficulties  Cognition Arousal/Alertness: Awake/alert Behavior During Therapy: WFL for tasks assessed/performed Overall Cognitive Status: Within Functional Limits for tasks assessed                      General Comments      Exercises Total Joint Exercises Ankle Circles/Pumps: AROM;Both;15 reps;Supine Quad Sets: AROM;Both;10 reps;Supine Heel Slides: AAROM;Right;20 reps;Supine Hip ABduction/ADduction: AAROM;Right;15 reps;Supine      Assessment/Plan    PT Assessment Patient needs continued PT services  PT Diagnosis Difficulty walking   PT Problem List Decreased strength;Decreased range  of motion;Decreased activity tolerance;Decreased mobility;Decreased knowledge of use of DME;Pain  PT Treatment Interventions DME instruction;Gait training;Stair training;Functional mobility training;Therapeutic activities;Therapeutic exercise;Patient/family education   PT Goals (Current goals can be found in the Care Plan section) Acute Rehab PT Goals Patient Stated Goal: Regain IND and resume previous active lifestyle  PT Goal  Formulation: With patient Time For Goal Achievement: 04/05/15 Potential to Achieve Goals: Good    Frequency 7X/week   Barriers to discharge        Co-evaluation               End of Session Equipment Utilized During Treatment: Gait belt Activity Tolerance: Patient tolerated treatment well Patient left: in chair;with call bell/phone within reach;with family/visitor present Nurse Communication: Mobility status         Time: 1610-9604 PT Time Calculation (min) (ACUTE ONLY): 24 min   Charges:   PT Evaluation $PT Eval Low Complexity: 1 Procedure PT Treatments $Therapeutic Exercise: 8-22 mins   PT G Codes:        Echo Propp 2015-04-09, 12:31 PM

## 2015-04-02 NOTE — Discharge Instructions (Signed)

## 2015-04-02 NOTE — Care Management Note (Signed)
Case Management Note  Patient Details  Name: Jacob Faulkner MRN: [id number] Date of Birth: 06/15/1975  Subjective/Objective:   s/p total replacement of right hip                Action/Plan:  NCM spoke to pt and wife at bedside. Offered choice for [facility]. Pt agreeable to [facility] for [facility]. Requested RW for home. Contacted [facility] for DME for home.    Expected Discharge Date:  04/02/2015              Expected Discharge Plan:  Home w Home Health Services  In-House Referral:  NA  Discharge planning Services  CM Consult  Post Acute Care Choice:  Home Health Choice offered to:  Patient  DME Arranged:  Walker rolling DME Agency:  [facility]  HH Arranged:  PT [facility] Agency:  [facility]  Status of Service:  Completed, signed off  Medicare Important Message Given:    Date Medicare IM Given:    Medicare IM give by:    Date Additional Medicare IM Given:    Additional Medicare Important Message give by:     If discussed at Long Length of Stay Meetings, dates discussed:    Additional Comments:  Elliot Cousin, RN 04/02/2015, 4:44 PM

## 2015-04-02 NOTE — Evaluation (Signed)
  Occupational Therapy Evaluation Patient Details Name: Jacob Faulkner MRN: [id number] DOB: Jun 18, 1975 Today's Date: 2015-04-13    History of Present Illness Pt s/p R THR 2* AVN   Clinical Impression   OT education complete.               Precautions / Restrictions Precautions Precautions: Fall Restrictions Weight Bearing Restrictions: No Other Position/Activity Restrictions: WBAT      Mobility Bed Mobility               General bed mobility comments: pt in chair  Transfers Overall transfer level: Needs assistance Equipment used: Rolling walker (2 wheeled) Transfers: Sit to/from [employer] Sit to Stand: Supervision Stand pivot transfers: Supervision       General transfer comment: VC for safety         ADL Overall ADL's : Needs assistance/impaired                     Lower Body Dressing: Minimal assistance;Sit to/from stand;Cueing for safety;Cueing for sequencing   Toilet Transfer: Supervision/safety;RW;Comfort height toilet;Ambulation         Tub/Shower Transfer Details (indicate cue type and reason): verbalized safety   General ADL Comments: Education provided regarding ADL activity s/p THA.  Wife will A as needed.                Pertinent Vitals/Pain Pain Score: 3  Pain Location: r hip Pain Descriptors / Indicators: Other (Comment) (stiffness)     Hand Dominance     Extremity/Trunk Assessment Upper Extremity Assessment Upper Extremity Assessment: Overall WFL for tasks assessed           Communication Communication Communication: No difficulties   Cognition Arousal/Alertness: Awake/alert Behavior During Therapy: WFL for tasks assessed/performed Overall Cognitive Status: Within Functional Limits for tasks assessed                                Home Living Family/patient expects to be discharged to:: Private residence Living Arrangements: Spouse/significant other;Children Available Help  at Discharge: Family Type of Home: House Home Access: Stairs to enter Secretary/administrator of Steps: 4 Entrance Stairs-Rails: Right Home Layout: One level     Bathroom Shower/Tub: Chief Strategy Officer: Standard     Home Equipment: None          Prior Functioning/Environment Level of Independence: Independent                      OT Goals(Current goals can be found in the care plan section) Acute Rehab OT Goals Patient Stated Goal: Regain IND and resume previous active lifestyle   OT Frequency:                End of Session    Activity Tolerance:  great Patient left:  in chair with call bell within reach   Time: 1245-1300 OT Time Calculation (min): 15 min Charges:  OT General Charges $OT Visit: 1 Procedure G-Codes:    Einar Crow D 13-Apr-2015, 1:04 PM

## 2016-03-08 ENCOUNTER — Ambulatory Visit (INDEPENDENT_AMBULATORY_CARE_PROVIDER_SITE_OTHER): Payer: Self-pay | Admitting: Orthopaedic Surgery

## 2017-04-14 IMAGING — RF DG HIP (WITH PELVIS) OPERATIVE*R*
1 series · 2 of 2 positions shown · non-contrast
Comparison: MRI 02/17/2015

CLINICAL DATA: Rt total hip replacement   .4 min fluoro

EXAM:
OPERATIVE RIGHT HIP (WITH PELVIS IF PERFORMED) 2 VIEWS
TECHNIQUE: Fluoroscopic spot image(s) were submitted for interpretation
post-operatively.

[Series 1: run · 2 of 2 slices shown]
[im 1/2]
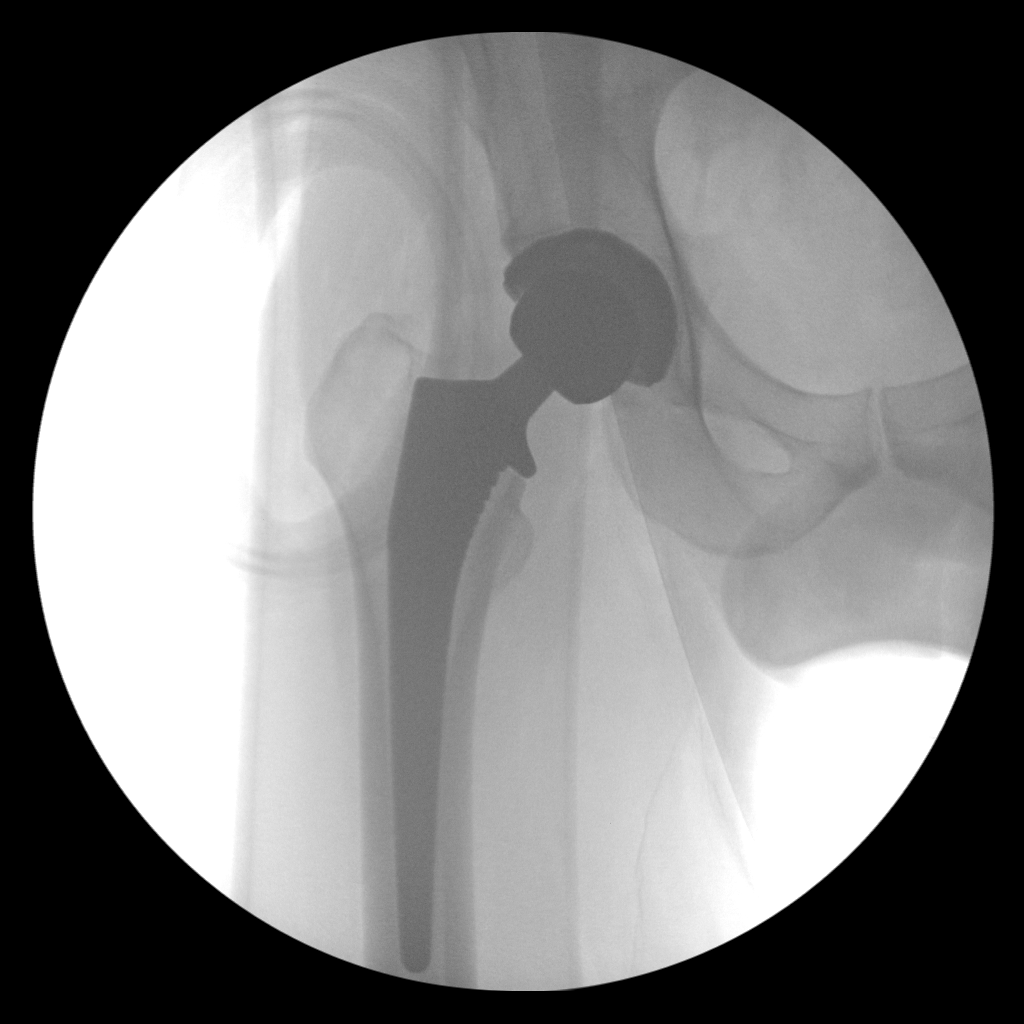
[im 2/2]
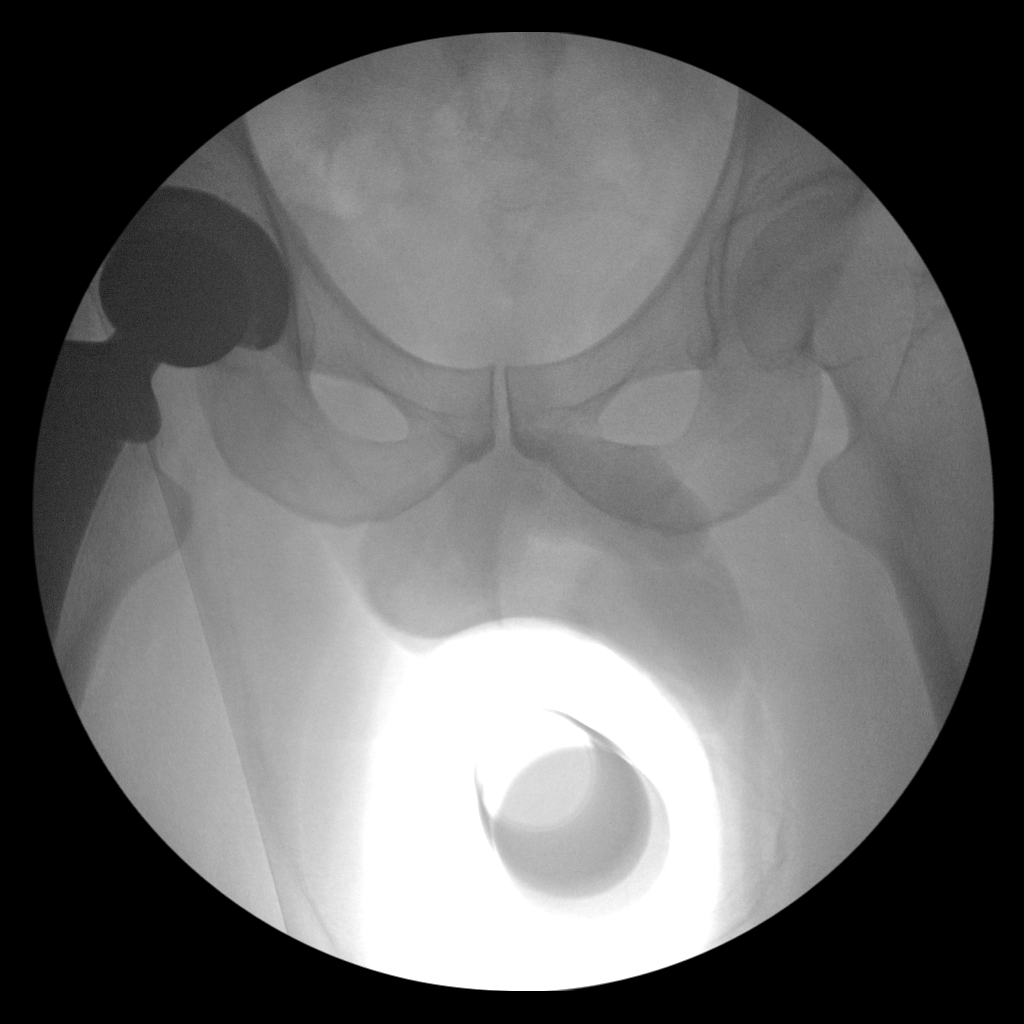

[2 of 2 positions shown; findings below may reference images not displayed]

FINDINGS: The patient has undergone right hip arthroplasty. No evidence for
dislocation on the frontal views provided. No acute fracture.
IMPRESSION: Status post total hip arthroplasty.  No adverse features.

## 2017-06-15 ENCOUNTER — Encounter (HOSPITAL_COMMUNITY): Payer: Self-pay | Admitting: Emergency Medicine

## 2017-06-15 ENCOUNTER — Other Ambulatory Visit: Payer: Self-pay

## 2017-06-15 ENCOUNTER — Emergency Department (HOSPITAL_COMMUNITY)
Admission: EM | Admit: 2017-06-15 | Discharge: 2017-06-15 | Disposition: A | Discharge status: Home / Self Care | Payer: BLUE CROSS/BLUE SHIELD | Attending: Physician Assistant | Admitting: Physician Assistant

## 2017-06-15 DIAGNOSIS — Z7982 Long term (current) use of aspirin: Secondary | ICD-10-CM | POA: Diagnosis not present

## 2017-06-15 DIAGNOSIS — N200 Calculus of kidney: Secondary | ICD-10-CM | POA: Diagnosis not present

## 2017-06-15 DIAGNOSIS — R1084 Generalized abdominal pain: Secondary | ICD-10-CM | POA: Diagnosis present

## 2017-06-15 DIAGNOSIS — J45909 Unspecified asthma, uncomplicated: Secondary | ICD-10-CM | POA: Insufficient documentation

## 2017-06-15 DIAGNOSIS — Z87891 Personal history of nicotine dependence: Secondary | ICD-10-CM | POA: Insufficient documentation

## 2017-06-15 LAB — URINALYSIS, ROUTINE W REFLEX MICROSCOPIC
Bilirubin Urine: NEGATIVE
Glucose, UA: NEGATIVE mg/dL
Ketones, ur: NEGATIVE mg/dL
Leukocytes, UA: NEGATIVE
Nitrite: NEGATIVE
PROTEIN: 30 mg/dL — AB
Specific Gravity, Urine: 1.027 (ref 1.005–1.030)
pH: 7 (ref 5.0–8.0)

## 2017-06-15 NOTE — ED Notes (Signed)
When patient urinated he passed a stone. Patient is feeling a little better.

## 2017-06-15 NOTE — ED Provider Notes (Signed)
[facility]-EMERGENCY DEPT Provider Note   CSN: [id number] Arrival date & time: 06/15/17  0458     History   Chief Complaint No chief complaint on file.   HPI Jacob Faulkner is a 42 y.o. male with a past medical history of GERD, who presents to ED for evaluation of resolved right-sided flank pain for the past 4 hours.  He states that he woke up with some right-sided abdominal pain.  He then urinated and then began experiencing severe right-sided flank pain and back pain.  Since being here in the ED and attempting to obtain a urine sample, patient states that he passed a stone that he visualized in the urine cup.  He reports complete resolution of his pain, nausea.  Denies any other symptoms at this time.  Denies any fever, vomiting, diarrhea, constipation, penile discharge.  He did note some blood in his urine.  Denies any previous history of nephrolithiasis.  HPI  Past Medical History:  Diagnosis Date  . Asthma   . Avascular necrosis of hip (HCC)    right hip  . GERD (gastroesophageal reflux disease)     Patient Active Problem List   Diagnosis Date Noted  . Avascular necrosis of bone of right hip (HCC) 04/01/2015  . Status post total replacement of right hip 04/01/2015    Past Surgical History:  Procedure Laterality Date  . KNEE ARTHROSCOPY     left  . TOTAL HIP ARTHROPLASTY Right 04/01/2015   Procedure: RIGHT TOTAL HIP ARTHROPLASTY ANTERIOR APPROACH;  Surgeon: Kathryne Hitch, MD;  Location: [facility] ORS;  Service: Orthopedics;  Laterality: Right;        Home Medications    Prior to Admission medications   Medication Sig Start Date End Date Taking? Authorizing Provider  albuterol (PROVENTIL HFA;VENTOLIN HFA) 108 (90 Base) MCG/ACT inhaler Inhale 1 puff into the lungs every 6 (six) hours as needed for wheezing or shortness of breath.    [provider]  aspirin EC 325 MG EC tablet Take 1 tablet (325 mg total) by mouth 2 (two) times daily  after a meal. 04/02/15   Kathryne Hitch, MD  methocarbamol (ROBAXIN) 500 MG tablet Take 1 tablet (500 mg total) by mouth every 6 (six) hours as needed for muscle spasms. 04/02/15   Kathryne Hitch, MD  oxyCODONE (OXY IR/ROXICODONE) 5 MG immediate release tablet Take 1-2 tablets (5-10 mg total) by mouth every 3 (three) hours as needed for breakthrough pain. 04/02/15   Kathryne Hitch, MD    Family History History reviewed. No pertinent family history.  Social History Social History   Tobacco Use  . Smoking status: Former Games developer  . Smokeless tobacco: Never Used  Substance Use Topics  . Alcohol use: No  . Drug use: No     Allergies   Omnicef [cefdinir] and Penicillins   Review of Systems Review of Systems  Constitutional: Negative for appetite change, chills and fever.  HENT: Negative for ear pain, rhinorrhea, sneezing and sore throat.   Eyes: Negative for photophobia and visual disturbance.  Respiratory: Negative for cough, chest tightness, shortness of breath and wheezing.   Cardiovascular: Negative for chest pain and palpitations.  Gastrointestinal: Negative for abdominal pain, blood in stool, constipation, diarrhea, nausea and vomiting.  Genitourinary: Positive for flank pain and hematuria. Negative for dysuria and urgency.  Musculoskeletal: Negative for myalgias.  Skin: Negative for rash.  Neurological: Negative for dizziness, weakness and light-headedness.     Physical Exam Updated Vital  Signs BP (!) 137/114 (BP Location: Right Arm)   Pulse 78   Temp 98.2 F (36.8 C) (Oral)   Resp 18   Ht 5\' 9"  (1.753 m)   Wt 102.5 kg (226 lb)   SpO2 99%   BMI 33.37 kg/m   Physical Exam  Constitutional: He appears well-developed and well-nourished. No distress.  HENT:  Head: Normocephalic and atraumatic.  Nose: Nose normal.  Eyes: Conjunctivae and EOM are normal. Left eye exhibits no discharge. No scleral icterus.  Neck: Normal range of motion. Neck  supple.  Cardiovascular: Normal rate, regular rhythm, normal heart sounds and intact distal pulses. Exam reveals no gallop and no friction rub.  No murmur heard. Pulmonary/Chest: Effort normal and breath sounds normal. No respiratory distress.  Abdominal: Soft. Bowel sounds are normal. He exhibits no distension. There is no tenderness. There is no guarding.  No abdominal tenderness to palpation.  No CVA tenderness bilaterally.  Musculoskeletal: Normal range of motion. He exhibits no edema.  Neurological: He is alert. He exhibits normal muscle tone. Coordination normal.  Skin: Skin is warm and dry. No rash noted.  Psychiatric: He has a normal mood and affect.  Nursing note and vitals reviewed.    ED Treatments / Results  Labs (all labs ordered are listed, but only abnormal results are displayed) Labs Reviewed  URINALYSIS, ROUTINE W REFLEX MICROSCOPIC - Abnormal; Notable for the following components:      Result Value   Hgb urine dipstick LARGE (*)    Protein, ur 30 (*)    Bacteria, UA RARE (*)    Squamous Epithelial / LPF 0-5 (*)    All other components within normal limits  BASIC METABOLIC PANEL  CBC    EKG None  Radiology No results found.  Procedures Procedures (including critical care time)  Medications Ordered in ED Medications - No data to display   Initial Impression / Assessment and Plan / ED Course  I have reviewed the triage vital signs and the nursing notes.  Pertinent labs & imaging results that were available during my care of the patient were reviewed by me and considered in my medical decision making (see chart for details).     Patient presents to ED for evaluation of completely resolved right-sided flank pain for the past 4 hours.  Since being in the ED, he states he did pass a kidney stone.  He reports improvement of his pain with this.  States that he is currently pain-free.  Denies any other symptoms at this time.  Denies any vomiting, fever,  dysuria.  Urinalysis with evidence of hematuria but no evidence of UTI. Patient's blood pressure initially elevated in the emergency department today. Patient denies headache, change in vision, numbness, weakness, chest pain, dyspnea, dizziness, or lightheadedness therefore doubt hypertensive emergency. Discussed elevated blood pressure with the patient and the need for primary care follow up with potential need to initiate or change antihypertensive medications and or for further evaluation. Discussed return precaution signs/symptoms for hypertensive emergency as listed above with the patient.  Will give follow-up referral for urology if symptoms persist.  No need for further workup at this time.  Advised to return for any severe worsening symptoms.  Portions of this note were generated with Scientist, clinical (histocompatibility and immunogenetics). Dictation errors may occur despite best attempts at proofreading.   Final Clinical Impressions(s) / ED Diagnoses   Final diagnoses:  Nephrolithiasis    ED Discharge Orders    None  Dietrich PatesKhatri, Carolyna Yerian, PA-C 06/15/17 0756    Abelino DerrickMackuen, Courteney Lyn, MD 06/15/17 1301

## 2019-03-02 DIAGNOSIS — R0981 Nasal congestion: Secondary | ICD-10-CM | POA: Diagnosis not present

## 2019-03-02 DIAGNOSIS — J4520 Mild intermittent asthma, uncomplicated: Secondary | ICD-10-CM | POA: Diagnosis not present

## 2019-03-02 MED FILL — ALBUTEROL SULFATE HFA 108 (: 108 (90 BAS | 16 days supply | Qty: 18 | Fill #0

## 2019-03-02 MED FILL — AZITHROMYCIN 250 MG TABLET: 250 | 5 days supply | Qty: 6 | Fill #0

## 2019-08-04 DIAGNOSIS — Z125 Encounter for screening for malignant neoplasm of prostate: Secondary | ICD-10-CM | POA: Diagnosis not present

## 2019-08-04 DIAGNOSIS — J4520 Mild intermittent asthma, uncomplicated: Secondary | ICD-10-CM | POA: Diagnosis not present

## 2019-08-04 DIAGNOSIS — R21 Rash and other nonspecific skin eruption: Secondary | ICD-10-CM | POA: Diagnosis not present

## 2019-08-04 DIAGNOSIS — Z0000 Encounter for general adult medical examination without abnormal findings: Secondary | ICD-10-CM | POA: Diagnosis not present

## 2019-08-04 DIAGNOSIS — E782 Mixed hyperlipidemia: Secondary | ICD-10-CM | POA: Diagnosis not present

## 2019-08-04 MED FILL — CLOBETASOL PROPIONATE 0.05: 0.05 | 30 days supply | Qty: 60 | Fill #0

## 2019-08-17 ENCOUNTER — Emergency Department (HOSPITAL_COMMUNITY)
Admission: EM | Admit: 2019-08-17 | Discharge: 2019-08-17 | Discharge status: Absent without leave | Payer: BLUE CROSS/BLUE SHIELD

## 2020-08-22 DIAGNOSIS — E782 Mixed hyperlipidemia: Secondary | ICD-10-CM | POA: Diagnosis not present

## 2020-08-22 DIAGNOSIS — Z125 Encounter for screening for malignant neoplasm of prostate: Secondary | ICD-10-CM | POA: Diagnosis not present

## 2020-08-22 DIAGNOSIS — R21 Rash and other nonspecific skin eruption: Secondary | ICD-10-CM | POA: Diagnosis not present

## 2020-08-22 DIAGNOSIS — J4520 Mild intermittent asthma, uncomplicated: Secondary | ICD-10-CM | POA: Diagnosis not present

## 2020-08-22 DIAGNOSIS — Z0001 Encounter for general adult medical examination with abnormal findings: Secondary | ICD-10-CM | POA: Diagnosis not present

## 2020-08-22 DIAGNOSIS — Z0000 Encounter for general adult medical examination without abnormal findings: Secondary | ICD-10-CM | POA: Diagnosis not present

## 2020-10-15 ENCOUNTER — Telehealth: Payer: BLUE CROSS/BLUE SHIELD | Admitting: Nurse Practitioner

## 2020-10-15 ENCOUNTER — Other Ambulatory Visit (HOSPITAL_COMMUNITY): Payer: Self-pay

## 2020-10-15 DIAGNOSIS — J00 Acute nasopharyngitis [common cold]: Secondary | ICD-10-CM | POA: Diagnosis not present

## 2020-10-15 MED ORDER — BENZONATATE 100 MG PO CAPS
100.0000 mg | ORAL_CAPSULE | Freq: Three times a day (TID) | ORAL | 0 refills | Status: DC | PRN
  Filled 2020-10-15: qty 20, 7d supply, fill #0

## 2020-10-15 MED ORDER — FLUTICASONE PROPIONATE 50 MCG/ACT NA SUSP
2.0000 | Freq: Every day | NASAL | 6 refills | Status: DC
  Filled 2020-10-15: qty 16, 30d supply, fill #0

## 2020-10-15 NOTE — Progress Notes (Signed)
E-Visit for Upper Respiratory Infection   We are sorry you are not feeling well.  Here is how we plan to help!  I really suggest you be tested for covid.  Based on what you have shared with me, it looks like you may have a viral upper respiratory infection.  Upper respiratory infections are caused by a large number of viruses; however, rhinovirus is the most common cause.   Symptoms vary from person to person, with common symptoms including sore throat, cough, fatigue or lack of energy and feeling of general discomfort.  A low-grade fever of up to 100.4 may present, but is often uncommon.  Symptoms vary however, and are closely related to a person's age or underlying illnesses.  The most common symptoms associated with an upper respiratory infection are nasal discharge or congestion, cough, sneezing, headache and pressure in the ears and face.  These symptoms usually persist for about 3 to 10 days, but can last up to 2 weeks.  It is important to know that upper respiratory infections do not cause serious illness or complications in most cases.    Upper respiratory infections can be transmitted from person to person, with the most common method of transmission being a person's hands.  The virus is able to live on the skin and can infect other persons for up to 2 hours after direct contact.  Also, these can be transmitted when someone coughs or sneezes; thus, it is important to cover the mouth to reduce this risk.  To keep the spread of the illness at bay, good hand hygiene is very important.  This is an infection that is most likely caused by a virus. There are no specific treatments other than to help you with the symptoms until the infection runs its course.  We are sorry you are not feeling well.  Here is how we plan to help!   For nasal congestion, you may use an oral decongestants such as Mucinex D or if you have glaucoma or high blood pressure use plain Mucinex.  Saline nasal spray or nasal drops  can help and can safely be used as often as needed for congestion.  For your congestion, I have prescribed Fluticasone nasal spray one spray in each nostril twice a day  If you do not have a history of heart disease, hypertension, diabetes or thyroid disease, prostate/bladder issues or glaucoma, you may also use Sudafed to treat nasal congestion.  It is highly recommended that you consult with a pharmacist or your primary care physician to ensure this medication is safe for you to take.     If you have a cough, you may use cough suppressants such as Delsym and Robitussin.  If you have glaucoma or high blood pressure, you can also use Coricidin HBP.   For cough I have prescribed for you A prescription cough medication called Tessalon Perles 100 mg. You may take 1-2 capsules every 8 hours as needed for cough  If you have a sore or scratchy throat, use a saltwater gargle-  to  teaspoon of salt dissolved in a 4-ounce to 8-ounce glass of warm water.  Gargle the solution for approximately 15-30 seconds and then spit.  It is important not to swallow the solution.  You can also use throat lozenges/cough drops and Chloraseptic spray to help with throat pain or discomfort.  Warm or cold liquids can also be helpful in relieving throat pain.  For headache, pain or general discomfort, you can use Ibuprofen  or Tylenol as directed.   Some authorities believe that zinc sprays or the use of Echinacea may shorten the course of your symptoms.   HOME CARE Only take medications as instructed by your medical team. Be sure to drink plenty of fluids. Water is fine as well as fruit juices, sodas and electrolyte beverages. You may want to stay away from caffeine or alcohol. If you are nauseated, try taking small sips of liquids. How do you know if you are getting enough fluid? Your urine should be a pale yellow or almost colorless. Get rest. Taking a steamy shower or using a humidifier may help nasal congestion and ease  sore throat pain. You can place a towel over your head and breathe in the steam from hot water coming from a faucet. Using a saline nasal spray works much the same way. Cough drops, hard candies and sore throat lozenges may ease your cough. Avoid close contacts especially the very young and the elderly Cover your mouth if you cough or sneeze Always remember to wash your hands.   GET HELP RIGHT AWAY IF: You develop worsening fever. If your symptoms do not improve within 10 days You develop yellow or green discharge from your nose over 3 days. You have coughing fits You develop a severe head ache or visual changes. You develop shortness of breath, difficulty breathing or start having chest pain Your symptoms persist after you have completed your treatment plan  MAKE SURE YOU  Understand these instructions. Will watch your condition. Will get help right away if you are not doing well or get worse.  Thank you for choosing an e-visit.  Your e-visit answers were reviewed by a board certified advanced clinical practitioner to complete your personal care plan. Depending upon the condition, your plan could have included both over the counter or prescription medications.  Please review your pharmacy choice. Make sure the pharmacy is open so you can pick up prescription now. If there is a problem, you may contact your provider through [employer] and have the prescription routed to another pharmacy.  Your safety is important to [place]. If you have drug allergies check your prescription carefully.   For the next 24 hours you can use MyChart to ask questions about today's visit, request a non-urgent call back, or ask for a work or school excuse. You will get an email in the next two days asking about your experience. I hope that your e-visit has been valuable and will speed your recovery.   5-10 minutes spent reviewing and documenting in chart.

## 2020-10-21 ENCOUNTER — Other Ambulatory Visit (HOSPITAL_COMMUNITY): Payer: Self-pay

## 2020-10-25 ENCOUNTER — Other Ambulatory Visit (HOSPITAL_COMMUNITY): Payer: Self-pay

## 2020-10-25 DIAGNOSIS — J40 Bronchitis, not specified as acute or chronic: Secondary | ICD-10-CM | POA: Diagnosis not present

## 2020-10-25 DIAGNOSIS — R059 Cough, unspecified: Secondary | ICD-10-CM | POA: Diagnosis not present

## 2020-10-25 DIAGNOSIS — Z6833 Body mass index (BMI) 33.0-33.9, adult: Secondary | ICD-10-CM | POA: Diagnosis not present

## 2020-10-25 MED ORDER — PREDNISONE 20 MG PO TABS
ORAL_TABLET | ORAL | 0 refills | Status: DC
  Filled 2020-10-25: qty 5, 5d supply, fill #0

## 2020-10-25 MED ORDER — AZITHROMYCIN 250 MG PO TABS
ORAL_TABLET | ORAL | 0 refills | Status: DC
  Filled 2020-10-25: qty 6, 5d supply, fill #0

## 2021-08-10 DIAGNOSIS — Z0000 Encounter for general adult medical examination without abnormal findings: Secondary | ICD-10-CM | POA: Diagnosis not present

## 2021-08-10 DIAGNOSIS — Z13220 Encounter for screening for lipoid disorders: Secondary | ICD-10-CM | POA: Diagnosis not present

## 2021-08-10 DIAGNOSIS — E782 Mixed hyperlipidemia: Secondary | ICD-10-CM | POA: Diagnosis not present

## 2021-08-10 DIAGNOSIS — J4520 Mild intermittent asthma, uncomplicated: Secondary | ICD-10-CM | POA: Diagnosis not present

## 2022-09-03 ENCOUNTER — Encounter: Payer: Self-pay | Admitting: Family Medicine

## 2022-09-03 ENCOUNTER — Ambulatory Visit: Payer: 59 | Admitting: Family Medicine

## 2022-09-03 ENCOUNTER — Other Ambulatory Visit (HOSPITAL_COMMUNITY): Payer: Self-pay

## 2022-09-03 VITALS — BP 152/102 | HR 99 | Temp 97.7°F | Ht 69.0 in | Wt 235.6 lb

## 2022-09-03 DIAGNOSIS — I10 Essential (primary) hypertension: Secondary | ICD-10-CM | POA: Diagnosis not present

## 2022-09-03 DIAGNOSIS — R000 Tachycardia, unspecified: Secondary | ICD-10-CM | POA: Insufficient documentation

## 2022-09-03 DIAGNOSIS — Z0001 Encounter for general adult medical examination with abnormal findings: Secondary | ICD-10-CM | POA: Diagnosis not present

## 2022-09-03 DIAGNOSIS — R0683 Snoring: Secondary | ICD-10-CM | POA: Diagnosis not present

## 2022-09-03 DIAGNOSIS — F341 Dysthymic disorder: Secondary | ICD-10-CM | POA: Insufficient documentation

## 2022-09-03 DIAGNOSIS — J4520 Mild intermittent asthma, uncomplicated: Secondary | ICD-10-CM

## 2022-09-03 DIAGNOSIS — L309 Dermatitis, unspecified: Secondary | ICD-10-CM | POA: Diagnosis not present

## 2022-09-03 DIAGNOSIS — I447 Left bundle-branch block, unspecified: Secondary | ICD-10-CM | POA: Diagnosis not present

## 2022-09-03 DIAGNOSIS — Z0000 Encounter for general adult medical examination without abnormal findings: Secondary | ICD-10-CM | POA: Insufficient documentation

## 2022-09-03 DIAGNOSIS — Z1211 Encounter for screening for malignant neoplasm of colon: Secondary | ICD-10-CM | POA: Insufficient documentation

## 2022-09-03 MED ORDER — LISINOPRIL 20 MG PO TABS
20.0000 mg | ORAL_TABLET | Freq: Every day | ORAL | 0 refills | Status: DC
Start: 2022-09-03 — End: 2022-10-16
  Filled 2022-09-03: qty 90, 90d supply, fill #0

## 2022-09-03 MED ORDER — ALBUTEROL SULFATE HFA 108 (90 BASE) MCG/ACT IN AERS
1.0000 | INHALATION_SPRAY | Freq: Four times a day (QID) | RESPIRATORY_TRACT | 0 refills | Status: DC | PRN
Start: 2022-09-03 — End: 2023-11-26
  Filled 2022-09-03: qty 6.7, 25d supply, fill #0

## 2022-09-03 MED ORDER — TRIAMCINOLONE ACETONIDE 0.025 % EX OINT
TOPICAL_OINTMENT | CUTANEOUS | 0 refills | Status: AC
Start: 2022-09-03 — End: ?
  Filled 2022-09-03: qty 30, 10d supply, fill #0

## 2022-09-03 NOTE — Progress Notes (Addendum)
[facility] unremarkable  New Patient Office Visit  Subjective    Patient ID: Jacob Faulkner, male    DOB: 02-28-1976  Age: 47 y.o. MRN: [id number]  CC:  Chief Complaint  Patient presents with   Establish Care    Pt requested to establish care and have referral to a cardiologist. Pt father has a Hx of heart issue. Pt has hight blood pressure.  Albuterol  Clobetosol Prop     HPI Jacob Faulkner presents to establish care. Encounter Diagnoses  Name Primary?   Healthcare maintenance Yes   Essential hypertension    Dermatitis    Dysthymia    Elevated pulse rate    Mild intermittent asthma without complication    Snores    LBBB (left bundle branch block)       For establishment of care and follow-up of above.  Ongoing history of elevated blood pressure.  Never treated.  It has been asymptomatic.  He appreciated irregular pulse at 1 point.  He works in the [employer] and checked an EKG with a couple of leads.  Believes that he saw 1 PVC every 10 beats.  Has had no chest pain or shortness of breath.  Pulse rate has been elevated.  He does not smoke, drink alcohol or use illicit drugs.  History of mild asthma.  Rarely needs an inhaler and never uses more than twice weekly.  He has used clobetasol for rash on his scrotum.  He snores and does not feel rested in the morning.  He is not exercising regularly.  Ongoing issues with sadness.  Just does not feel motivated to exercise regularly.  Outpatient Encounter Medications as of 09/03/2022  Medication Sig   lisinopril (ZESTRIL) 20 MG tablet Take 1 tablet (20 mg total) by mouth daily.   Multiple Vitamin (MULTIVITAMIN) tablet Take 1 tablet by mouth daily.   Omega-3 Fatty Acids (FISH OIL) 300 MG CAPS Take by mouth.   triamcinolone (KENALOG) 0.025 % ointment May apply to rash on scrotum twice daily as needed.   Turmeric 07-998 MG CAPS Take by mouth.   albuterol (VENTOLIN HFA) 108 (90 Base) MCG/ACT inhaler Inhale 1 puff into the lungs every 6 (six) hours as  needed for wheezing or shortness of breath.   [DISCONTINUED] albuterol (PROVENTIL HFA;VENTOLIN HFA) 108 (90 Base) MCG/ACT inhaler Inhale 1 puff into the lungs every 6 (six) hours as needed for wheezing or shortness of breath.   [DISCONTINUED] aspirin EC 325 MG EC tablet Take 1 tablet (325 mg total) by mouth 2 (two) times daily after a meal.   [DISCONTINUED] azithromycin (ZITHROMAX) 250 MG tablet Take 2 tablets by mouth today and then 1 tablet by mouth daily for 4 days   [DISCONTINUED] benzonatate (TESSALON PERLES) 100 MG capsule Take 1 capsule (100 mg total) by mouth 3 (three) times daily as needed.   [DISCONTINUED] fluticasone (FLONASE) 50 MCG/ACT nasal spray Place 2 sprays into both nostrils daily.   [DISCONTINUED] methocarbamol (ROBAXIN) 500 MG tablet Take 1 tablet (500 mg total) by mouth every 6 (six) hours as needed for muscle spasms.   [DISCONTINUED] oxyCODONE (OXY IR/ROXICODONE) 5 MG immediate release tablet Take 1-2 tablets (5-10 mg total) by mouth every 3 (three) hours as needed for breakthrough pain.   [DISCONTINUED] predniSONE (DELTASONE) 20 MG tablet Take 1 tablet by mouth daily for 5 days   No facility-administered encounter medications on file as of 09/03/2022.    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 No pertinent family history.  Social History   Socioeconomic History   Marital status: Married    Spouse name: Not on file   Number of children: Not on file   Years of education: Not on file   Highest education level: Not on file  Occupational History   Not on file   Tobacco Use   Smoking status: Former   Smokeless tobacco: Never  Vaping Use   Vaping Use: Never used  Substance and Sexual Activity   Alcohol use: No   Drug use: No   Sexual activity: Not on file  Other Topics Concern   Not on file  Social History Narrative   Not on file   Social Determinants of Health   Financial Resource Strain: Not on file  Food Insecurity: Not on file  Transportation Needs: Not on file  Physical Activity: Not on file  Stress: Not on file  Social Connections: Not on file  Intimate Partner Violence: Not on file    Review of Systems  Constitutional: Negative.   HENT: Negative.    Eyes:  Negative for blurred vision, discharge and redness.  Respiratory: Negative.    Cardiovascular: Negative.   Gastrointestinal:  Negative for abdominal pain.  Genitourinary: Negative.   Musculoskeletal: Negative.  Negative for myalgias.  Skin:  Negative for rash.  Neurological:  Negative for tingling, loss of consciousness and weakness.  Endo/Heme/Allergies:  Negative for polydipsia.        Objective    BP (!) 152/102   Pulse 99   Temp 97.7 F (36.5 C)   Ht 5\' 9"  (1.753 m)   Wt 235 lb 9.6 oz (106.9 kg)   BMI 34.79 kg/m   Physical Exam Constitutional:      General: He is not in acute distress.    Appearance: Normal appearance. He is not ill-appearing, toxic-appearing or diaphoretic.  HENT:     Head: Normocephalic and atraumatic.     Right Ear: External ear normal.     Left Ear: External ear normal.     Mouth/Throat:     Mouth: Mucous membranes are moist.     Pharynx: Oropharynx is clear. No oropharyngeal exudate or posterior oropharyngeal erythema.   Eyes:     General: No scleral icterus.       Right eye: No discharge.        Left eye: No discharge.     Extraocular Movements: Extraocular movements intact.     Conjunctiva/sclera: Conjunctivae normal.     Pupils: Pupils are equal, round, and reactive to light.  Cardiovascular:     Rate and Rhythm:  Normal rate and regular rhythm.  Pulmonary:     Effort: Pulmonary effort is normal. No respiratory distress.     Breath sounds: Normal breath sounds.  Abdominal:     General: Bowel sounds are normal.     Tenderness: There is no abdominal tenderness. There is no guarding.     Hernia: There is no hernia in the left inguinal area or right inguinal area.  Genitourinary:    Penis: Circumcised. No hypospadias,  erythema, discharge, swelling or lesions.      Testes:        Right: Mass, tenderness or swelling not present. Right testis is descended.        Left: Mass, tenderness or swelling not present. Left testis is descended.     Epididymis:     Right: Not inflamed or enlarged.     Left: Not inflamed or enlarged.    Musculoskeletal:     Cervical back: No rigidity or tenderness.  Lymphadenopathy:     Lower Body: No right inguinal adenopathy. No left inguinal adenopathy.  Skin:    General: Skin is warm and dry.  Neurological:     Mental Status: He is alert and oriented to person, place, and time.  Psychiatric:        Mood and Affect: Mood normal.        Behavior: Behavior normal.         Assessment & Plan:   Healthcare maintenance -     CBC; Future -     Comprehensive metabolic panel; Future -     Lipid panel; Future -     Hemoglobin A1c; Future -     Urinalysis, Routine w reflex microscopic; Future  Essential hypertension -     EKG 12-Lead -     Lisinopril; Take 1 tablet (20 mg total) by mouth daily.  Dispense: 90 tablet; Refill: 0  Dermatitis -     Triamcinolone Acetonide; May apply to rash on scrotum twice daily as needed.  Dispense: 30 g; Refill: 0  Dysthymia -     TSH; Future  Elevated pulse rate -     EKG 12-Lead  Mild intermittent asthma without complication -     Albuterol Sulfate HFA; Inhale 1 puff into the lungs every 6 (six) hours as needed for wheezing or shortness of breath.  Dispense: 6.7 g; Refill: 0  Snores -     Ambulatory referral to  Pulmonology  LBBB (left bundle branch block) -     Ambulatory referral to Cardiology     Return in about 6 weeks (around 10/15/2022).  Start lisinopril 20 daily.  Referral to pulmonology for apnea evaluation.  Cardiology referral for left bundle branch block evaluation.  Return fasting for above ordered blood work.  Steroid ointment on the milder variety for scrotal rash.  Mliss Sax, MD

## 2022-09-11 ENCOUNTER — Other Ambulatory Visit (INDEPENDENT_AMBULATORY_CARE_PROVIDER_SITE_OTHER): Payer: 59

## 2022-09-11 DIAGNOSIS — Z0000 Encounter for general adult medical examination without abnormal findings: Secondary | ICD-10-CM

## 2022-09-11 DIAGNOSIS — F341 Dysthymic disorder: Secondary | ICD-10-CM

## 2022-09-11 LAB — COMPREHENSIVE METABOLIC PANEL
ALT: 17 U/L (ref 0–53)
AST: 17 U/L (ref 0–37)
Albumin: 4.4 g/dL (ref 3.5–5.2)
Alkaline Phosphatase: 52 U/L (ref 39–117)
BUN: 18 mg/dL (ref 6–23)
CO2: 27 mEq/L (ref 19–32)
Calcium: 9.2 mg/dL (ref 8.4–10.5)
Chloride: 104 mEq/L (ref 96–112)
Creatinine, Ser: 1.02 mg/dL (ref 0.40–1.50)
GFR: 88.42 mL/min (ref >60.00)
Glucose, Bld: 87 mg/dL (ref 70–99)
Potassium: 4.3 mEq/L (ref 3.5–5.1)
Sodium: 137 mEq/L (ref 135–145)
Total Bilirubin: 1.5 mg/dL — ABNORMAL HIGH (ref 0.2–1.2)
Total Protein: 6.7 g/dL (ref 6.0–8.3)

## 2022-09-11 LAB — LIPID PANEL
Cholesterol: 243 mg/dL — ABNORMAL HIGH (ref 0–200)
HDL: 53.4 mg/dL (ref >39.00)
LDL Cholesterol: 167 mg/dL — ABNORMAL HIGH (ref 0–99)
NonHDL: 189.61
Total CHOL/HDL Ratio: 5
Triglycerides: 114 mg/dL (ref 0.0–149.0)
VLDL: 22.8 mg/dL (ref 0.0–40.0)

## 2022-09-11 LAB — HEMOGLOBIN A1C: Hgb A1c MFr Bld: 5.7 % (ref 4.6–6.5)

## 2022-09-11 LAB — CBC
HCT: 45.5 % (ref 39.0–52.0)
Hemoglobin: 15.2 g/dL (ref 13.0–17.0)
MCHC: 33.4 g/dL (ref 30.0–36.0)
MCV: 86.9 fl (ref 78.0–100.0)
Platelets: 195 10*3/uL (ref 150.0–400.0)
RBC: 5.23 Mil/uL (ref 4.22–5.81)
RDW: 13.8 % (ref 11.5–15.5)
WBC: 5.8 10*3/uL (ref 4.0–10.5)

## 2022-09-11 LAB — TSH: TSH: 0.87 u[IU]/mL (ref 0.35–5.50)

## 2022-09-14 ENCOUNTER — Other Ambulatory Visit: Payer: 59

## 2022-09-14 ENCOUNTER — Other Ambulatory Visit: Payer: Self-pay | Admitting: Family

## 2022-09-14 ENCOUNTER — Telehealth: Payer: Self-pay | Admitting: Family Medicine

## 2022-09-14 ENCOUNTER — Other Ambulatory Visit (HOSPITAL_COMMUNITY): Payer: Self-pay

## 2022-09-14 DIAGNOSIS — Z0000 Encounter for general adult medical examination without abnormal findings: Secondary | ICD-10-CM

## 2022-09-14 MED ORDER — SIMVASTATIN 10 MG PO TABS
10.0000 mg | ORAL_TABLET | Freq: Every day | ORAL | 3 refills | Status: DC
Start: 2022-09-14 — End: 2022-12-14

## 2022-09-14 NOTE — Telephone Encounter (Signed)
Pt was called and given this message--Are you willing to try a low dose statin? Otherwise labs are stable. He would like to start a low dose statin.  [facility] [address], [place] [address] Phone: [phone]  Fax: [phone] DEA #: [id number]  Pt at [phone]

## 2022-09-15 LAB — URINALYSIS, ROUTINE W REFLEX MICROSCOPIC
Bilirubin Urine: NEGATIVE
Glucose, UA: NEGATIVE
Hgb urine dipstick: NEGATIVE
Ketones, ur: NEGATIVE
Leukocytes,Ua: NEGATIVE
Nitrite: NEGATIVE
Protein, ur: NEGATIVE
Specific Gravity, Urine: 1.024 (ref 1.001–1.035)
pH: 5.5 (ref 5.0–8.0)

## 2022-09-18 ENCOUNTER — Other Ambulatory Visit: Payer: 59

## 2022-10-03 ENCOUNTER — Encounter (INDEPENDENT_AMBULATORY_CARE_PROVIDER_SITE_OTHER): Payer: Self-pay

## 2022-10-04 ENCOUNTER — Other Ambulatory Visit: Payer: Self-pay | Admitting: Oncology

## 2022-10-04 DIAGNOSIS — Z006 Encounter for examination for normal comparison and control in clinical research program: Secondary | ICD-10-CM

## 2022-10-16 ENCOUNTER — Other Ambulatory Visit: Payer: Self-pay

## 2022-10-16 ENCOUNTER — Encounter: Payer: Self-pay | Admitting: Family Medicine

## 2022-10-16 ENCOUNTER — Ambulatory Visit: Payer: 59 | Admitting: Family Medicine

## 2022-10-16 ENCOUNTER — Other Ambulatory Visit (HOSPITAL_COMMUNITY): Payer: Self-pay

## 2022-10-16 VITALS — BP 136/86 | HR 77 | Temp 98.1°F | Ht 69.0 in | Wt 230.0 lb

## 2022-10-16 DIAGNOSIS — I10 Essential (primary) hypertension: Secondary | ICD-10-CM

## 2022-10-16 DIAGNOSIS — K429 Umbilical hernia without obstruction or gangrene: Secondary | ICD-10-CM | POA: Diagnosis not present

## 2022-10-16 DIAGNOSIS — Z1211 Encounter for screening for malignant neoplasm of colon: Secondary | ICD-10-CM

## 2022-10-16 DIAGNOSIS — R0982 Postnasal drip: Secondary | ICD-10-CM | POA: Diagnosis not present

## 2022-10-16 LAB — BASIC METABOLIC PANEL
BUN: 20 mg/dL (ref 6–23)
CO2: 28 mEq/L (ref 19–32)
Calcium: 9.7 mg/dL (ref 8.4–10.5)
Chloride: 100 mEq/L (ref 96–112)
Creatinine, Ser: 0.98 mg/dL (ref 0.40–1.50)
GFR: 92.71 mL/min (ref >60.00)
Glucose, Bld: 90 mg/dL (ref 70–99)
Potassium: 5.2 mEq/L — ABNORMAL HIGH (ref 3.5–5.1)
Sodium: 136 mEq/L (ref 135–145)

## 2022-10-16 MED ORDER — LISINOPRIL 40 MG PO TABS
40.0000 mg | ORAL_TABLET | Freq: Every day | ORAL | 3 refills | Status: DC
Start: 2022-10-16 — End: 2022-12-14
  Filled 2022-10-16: qty 90, 90d supply, fill #0

## 2022-10-16 MED ORDER — TRIAMCINOLONE ACETONIDE 55 MCG/ACT NA AERO
2.0000 | INHALATION_SPRAY | Freq: Two times a day (BID) | NASAL | 0 refills | Status: DC
Start: 2022-10-16 — End: 2022-10-16

## 2022-10-16 MED ORDER — TRIAMCINOLONE ACETONIDE 55 MCG/ACT NA AERO
2.0000 | INHALATION_SPRAY | Freq: Two times a day (BID) | NASAL | 0 refills | Status: DC
Start: 2022-10-16 — End: 2022-12-12
  Filled 2022-10-16: qty 1.56, 30d supply, fill #0

## 2022-10-16 MED ORDER — LISINOPRIL 40 MG PO TABS
40.0000 mg | ORAL_TABLET | Freq: Every day | ORAL | 3 refills | Status: DC
Start: 2022-10-16 — End: 2022-10-16

## 2022-10-16 NOTE — Progress Notes (Signed)
Established Patient Office Visit   Subjective:  Patient ID: Jacob Faulkner, male    DOB: 10/20/1975  Age: 47 y.o. MRN: [id number]  Chief Complaint  Patient presents with   Medical Management of Chronic Issues    ^ week fasting follow up. Pt complains of a lingering cough from a recent sinus infection.     HPI Encounter Diagnoses  Name Primary?   Essential hypertension Yes   Screening for colon cancer    Umbilical hernia without obstruction and without gangrene    Post-nasal drainage    For follow-up of above.  He is doing well with the lisinopril for his blood pressure and simvastatin.  Blood pressure at home is been running in the 130s over 80s which is a great improvement over to where it been.  He has ongoing postnasal drip without cough.  His cough is nonproductive.  It is not dry and sporadic like a cough that may be associated with an ACE inhibitor.   Review of Systems  Constitutional: Negative.   HENT: Negative.    Eyes:  Negative for blurred vision, discharge and redness.  Respiratory:  Positive for cough. Negative for sputum production, shortness of breath and wheezing.   Cardiovascular: Negative.   Gastrointestinal:  Negative for abdominal pain.  Genitourinary: Negative.   Musculoskeletal: Negative.  Negative for myalgias.  Skin:  Negative for rash.  Neurological:  Negative for tingling, loss of consciousness and weakness.  Endo/Heme/Allergies:  Negative for polydipsia.     Current Outpatient Medications:    albuterol (VENTOLIN HFA) 108 (90 Base) MCG/ACT inhaler, Inhale 1 puff into the lungs every 6 (six) hours as needed for wheezing or shortness of breath., Disp: 6.7 g, Rfl: 0   Multiple Vitamin (MULTIVITAMIN) tablet, Take 1 tablet by mouth daily., Disp: , Rfl:    Omega-3 Fatty Acids (FISH OIL) 300 MG CAPS, Take by mouth., Disp: , Rfl:    simvastatin (ZOCOR) 10 MG tablet, Take 1 tablet (10 mg total) by mouth at bedtime., Disp: 90 tablet, Rfl: 3   triamcinolone  (KENALOG) 0.025 % ointment, May apply to rash on scrotum twice daily as needed., Disp: 30 g, Rfl: 0   Turmeric 07-998 MG CAPS, Take by mouth., Disp: , Rfl:    lisinopril (ZESTRIL) 40 MG tablet, Take 1 tablet (40 mg total) by mouth daily., Disp: 90 tablet, Rfl: 3   triamcinolone (NASACORT) 55 MCG/ACT AERO nasal inhaler, Place 2 sprays into the nose 2 (two) times daily., Disp: 16.9 each, Rfl: 0   Objective:     BP 136/86   Pulse 77   Temp 98.1 F (36.7 C)   Ht 5\' 9"  (1.753 m)   Wt 230 lb (104.3 kg)   SpO2 98%   BMI 33.97 kg/m  BP Readings from Last 3 Encounters:  10/16/22 136/86  09/03/22 (!) 152/102  06/15/17 (!) 137/114   Wt Readings from Last 3 Encounters:  10/16/22 230 lb (104.3 kg)  09/03/22 235 lb 9.6 oz (106.9 kg)  06/15/17 226 lb (102.5 kg)      Physical Exam Constitutional:      General: He is not in acute distress.    Appearance: Normal appearance. He is not ill-appearing, toxic-appearing or diaphoretic.  HENT:     Head: Normocephalic and atraumatic.     Right Ear: External ear normal.     Left Ear: External ear normal.     Mouth/Throat:     Mouth: Mucous membranes are moist.     Pharynx:  Oropharynx is clear. No oropharyngeal exudate or posterior oropharyngeal erythema.  Eyes:     General: No scleral icterus.       Right eye: No discharge.        Left eye: No discharge.     Extraocular Movements: Extraocular movements intact.     Conjunctiva/sclera: Conjunctivae normal.     Pupils: Pupils are equal, round, and reactive to light.  Cardiovascular:     Rate and Rhythm: Normal rate and regular rhythm.  Pulmonary:     Effort: Pulmonary effort is normal. No respiratory distress.     Breath sounds: Normal breath sounds. No wheezing, rhonchi or rales.  Abdominal:     General: Bowel sounds are normal.     Hernia: A hernia is present. Hernia is present in the umbilical area (small umbilical hernia.).  Musculoskeletal:     Cervical back: No rigidity or tenderness.   Skin:    General: Skin is warm and dry.  Neurological:     Mental Status: He is alert and oriented to person, place, and time.  Psychiatric:        Mood and Affect: Mood normal.        Behavior: Behavior normal.      No results found for any visits on 10/16/22.    The 10-year ASCVD risk score (Arnett DK, et al., 2019) is: 3.8%    Assessment & Plan:   Essential hypertension -     Basic metabolic panel -     Lisinopril; Take 1 tablet (40 mg total) by mouth daily.  Dispense: 90 tablet; Refill: 3  Screening for colon cancer -     Ambulatory referral to Gastroenterology  Umbilical hernia without obstruction and without gangrene  Post-nasal drainage -     Triamcinolone Acetonide; Place 2 sprays into the nose 2 (two) times daily.  Dispense: 16.9 each; Refill: 0    Return in about 8 weeks (around 12/11/2022), or if symptoms worsen or fail to improve.  Information was given on managing hypertension.  Advised him to expect a 5-10 point drop in his pressure with the higher dose of lisinopril.  Nasalide should help the postnasal drip which in turn helps the cough.  Mliss Sax, MD

## 2022-10-18 ENCOUNTER — Encounter: Payer: Self-pay | Admitting: Family Medicine

## 2022-10-24 ENCOUNTER — Encounter: Payer: Self-pay | Admitting: Gastroenterology

## 2022-11-11 NOTE — Progress Notes (Unsigned)
No chief complaint on file.  History of Present Illness: 47 yo male with history of asthma and GERD who is here today as a new patient for the evaluation of ***.   Primary Care Physician: Mliss Sax, MD   Past Medical History:  Diagnosis Date   Asthma    Avascular necrosis of hip (HCC)    right hip   GERD (gastroesophageal reflux disease)     Past Surgical History:  Procedure Laterality Date   KNEE ARTHROSCOPY     left   TOTAL HIP ARTHROPLASTY Right 04/01/2015   Procedure: RIGHT TOTAL HIP ARTHROPLASTY ANTERIOR APPROACH;  Surgeon: Kathryne Hitch, MD;  Location: [facility];  Service: Orthopedics;  Laterality: Right;    Current Outpatient Medications  Medication Sig Dispense Refill   albuterol (VENTOLIN HFA) 108 (90 Base) MCG/ACT inhaler Inhale 1 puff into the lungs every 6 (six) hours as needed for wheezing or shortness of breath. 6.7 g 0   lisinopril (ZESTRIL) 40 MG tablet Take 1 tablet (40 mg total) by mouth daily. 90 tablet 3   Multiple Vitamin (MULTIVITAMIN) tablet Take 1 tablet by mouth daily.     Omega-3 Fatty Acids (FISH OIL) 300 MG CAPS Take by mouth.     simvastatin (ZOCOR) 10 MG tablet Take 1 tablet (10 mg total) by mouth at bedtime. 90 tablet 3   triamcinolone (KENALOG) 0.025 % ointment May apply to rash on scrotum twice daily as needed. 30 g 0   triamcinolone (NASACORT) 55 MCG/ACT AERO nasal inhaler Place 2 sprays into the nose 2 (two) times daily. 16.9 each 0   Turmeric 07-998 MG CAPS Take by mouth.     No current facility-administered medications for this visit.    Allergies  Allergen Reactions   Omnicef [Cefdinir] Hives   Penicillins Hives    Has patient had a PCN reaction causing immediate rash, facial/tongue/throat swelling, SOB or lightheadedness with hypotension: No Has patient had a PCN reaction causing severe rash involving mucus membranes or skin necrosis: No Has patient had a PCN reaction that required hospitalization No Has patient  had a PCN reaction occurring within the last 10 years: No If all of the above answers are "NO", then may proceed with Cephalosporin use.    Social History   Socioeconomic History   Marital status: Married    Spouse name: Not on file   Number of children: Not on file   Years of education: Not on file   Highest education level: Not on file  Occupational History   Not on file  Tobacco Use   Smoking status: Former   Smokeless tobacco: Never  Vaping Use   Vaping status: Never Used  Substance and Sexual Activity   Alcohol use: No   Drug use: No   Sexual activity: Not on file  Other Topics Concern   Not on file  Social History Narrative   Not on file   Social Determinants of Health   Financial Resource Strain: Not on file  Food Insecurity: Not on file  Transportation Needs: Not on file  Physical Activity: Not on file  Stress: Not on file  Social Connections: Not on file  Intimate Partner Violence: Not on file    No family history on file.  Review of Systems:  As stated in the HPI and otherwise negative.   There were no vitals taken for this visit.  Physical Examination: General: Well developed, well nourished, NAD  HEENT: OP clear, mucus membranes moist  SKIN: warm, dry. No rashes. Neuro: No focal deficits  Musculoskeletal: Muscle strength 5/5 all ext  Psychiatric: Mood and affect normal  Neck: No JVD, no carotid bruits, no thyromegaly, no lymphadenopathy.  Lungs:Clear bilaterally, no wheezes, rhonci, crackles Cardiovascular: Regular rate and rhythm. No murmurs, gallops or rubs. Abdomen:Soft. Bowel sounds present. Non-tender.  Extremities: No lower extremity edema. Pulses are 2 + in the bilateral DP/PT.  EKG:  EKG {ACTION; IS/IS [id number]} ordered today. The ekg ordered today demonstrates ***  Recent Labs: 09/11/2022: ALT 17; Hemoglobin 15.2; Platelets 195.0; TSH 0.87 10/16/2022: BUN 20; Creatinine, Ser 0.98; Potassium 5.2; Sodium 136   Lipid Panel     Component Value Date/Time   CHOL 243 (H) 09/11/2022 0818   TRIG 114.0 09/11/2022 0818   HDL 53.40 09/11/2022 0818   CHOLHDL 5 09/11/2022 0818   VLDL 22.8 09/11/2022 0818   LDLCALC 167 (H) 09/11/2022 0818     Wt Readings from Last 3 Encounters:  10/16/22 104.3 kg  09/03/22 106.9 kg  06/15/17 102.5 kg      Assessment and Plan:   1.   Labs/ tests ordered today include:  No orders of the defined types were placed in this encounter.    Disposition:   F/U with me in ***    Signed, Verne Carrow, MD, [facility] 11/11/2022 3:19 PM    [facility] [address] [place], [place], [place]  [address] Phone: [phone]; Fax: [phone]

## 2022-11-12 ENCOUNTER — Ambulatory Visit: Payer: 59 | Attending: Cardiovascular Disease | Admitting: Cardiovascular Disease

## 2022-11-12 ENCOUNTER — Encounter: Payer: Self-pay | Admitting: Cardiovascular Disease

## 2022-11-12 VITALS — BP 116/80 | HR 69 | Ht 69.0 in | Wt 229.0 lb

## 2022-11-12 DIAGNOSIS — I493 Ventricular premature depolarization: Secondary | ICD-10-CM

## 2022-11-12 DIAGNOSIS — Z7189 Other specified counseling: Secondary | ICD-10-CM

## 2022-11-12 DIAGNOSIS — E7800 Pure hypercholesterolemia, unspecified: Secondary | ICD-10-CM

## 2022-11-12 DIAGNOSIS — I447 Left bundle-branch block, unspecified: Secondary | ICD-10-CM | POA: Diagnosis not present

## 2022-11-12 NOTE — Patient Instructions (Signed)
Medication Instructions:  No changes *If you need a refill on your cardiac medications before your next appointment, please call your pharmacy*   Lab Work: none   Testing/Procedures: Calcium score ct scan - $99 out of pocket charge   Follow-Up: At [facility], you and your health needs are our priority.  As part of our continuing mission to provide you with exceptional heart care, we have created designated Provider Care Teams.  These Care Teams include your primary Cardiologist (physician) and Advanced Practice Providers (APPs -  Physician Assistants and Nurse Practitioners) who all work together to provide you with the care you need, when you need it.   Your next appointment:   2 year(s)  Provider:   Alverda Skeans, MD

## 2022-11-20 ENCOUNTER — Ambulatory Visit (HOSPITAL_COMMUNITY)
Admission: RE | Admit: 2022-11-20 | Discharge: 2022-11-20 | Disposition: A | Discharge status: Home / Self Care | Payer: Self-pay | Source: Ambulatory Visit | Attending: Cardiovascular Disease | Admitting: Cardiovascular Disease

## 2022-11-20 DIAGNOSIS — Z7189 Other specified counseling: Secondary | ICD-10-CM

## 2022-11-28 ENCOUNTER — Encounter: Payer: Self-pay | Admitting: Gastroenterology

## 2022-11-28 ENCOUNTER — Ambulatory Visit (AMBULATORY_SURGERY_CENTER): Payer: 59 | Admitting: *Deleted

## 2022-11-28 ENCOUNTER — Other Ambulatory Visit (HOSPITAL_COMMUNITY): Payer: Self-pay

## 2022-11-28 VITALS — Ht 69.0 in | Wt 224.0 lb

## 2022-11-28 DIAGNOSIS — Z1211 Encounter for screening for malignant neoplasm of colon: Secondary | ICD-10-CM

## 2022-11-28 MED ORDER — NA SULFATE-K SULFATE-MG SULF 17.5-3.13-1.6 GM/177ML PO SOLN
1.0000 | Freq: Once | ORAL | 0 refills | Status: AC
Start: 2022-11-28 — End: 2022-11-29
  Filled 2022-11-28: qty 354, 1d supply, fill #0

## 2022-11-28 NOTE — Progress Notes (Signed)

## 2022-12-12 ENCOUNTER — Encounter: Payer: Self-pay | Admitting: Gastroenterology

## 2022-12-12 ENCOUNTER — Ambulatory Visit: Payer: 59 | Admitting: Gastroenterology

## 2022-12-12 VITALS — BP 84/51 | HR 84 | Temp 96.6°F | Resp 13 | Ht 69.0 in | Wt 224.0 lb

## 2022-12-12 DIAGNOSIS — D128 Benign neoplasm of rectum: Secondary | ICD-10-CM | POA: Diagnosis not present

## 2022-12-12 DIAGNOSIS — D127 Benign neoplasm of rectosigmoid junction: Secondary | ICD-10-CM | POA: Diagnosis not present

## 2022-12-12 DIAGNOSIS — I10 Essential (primary) hypertension: Secondary | ICD-10-CM | POA: Diagnosis not present

## 2022-12-12 DIAGNOSIS — D122 Benign neoplasm of ascending colon: Secondary | ICD-10-CM

## 2022-12-12 DIAGNOSIS — J45909 Unspecified asthma, uncomplicated: Secondary | ICD-10-CM | POA: Diagnosis not present

## 2022-12-12 DIAGNOSIS — Z1211 Encounter for screening for malignant neoplasm of colon: Secondary | ICD-10-CM | POA: Diagnosis not present

## 2022-12-12 DIAGNOSIS — D125 Benign neoplasm of sigmoid colon: Secondary | ICD-10-CM

## 2022-12-12 DIAGNOSIS — F32A Depression, unspecified: Secondary | ICD-10-CM | POA: Diagnosis not present

## 2022-12-12 MED ORDER — SODIUM CHLORIDE 0.9 % IV SOLN: 500.0000 mL | Freq: Once | INTRAVENOUS | Status: DC

## 2022-12-12 NOTE — Patient Instructions (Signed)

## 2022-12-12 NOTE — Progress Notes (Signed)
[facility] History and Physical   Primary Care Physician:  Mliss Sax, MD   Reason for Procedure:   Colon cancer screening  Plan:    Screening colonoscopy     HPI: Jacob Faulkner is a 47 y.o. male undergoing initial average risk screening colonoscopy.  He has no family history of colon cancer and no chronic GI symptoms.    Past Medical History:  Diagnosis Date   Allergy    seasonal   Asthma    Avascular necrosis of hip (HCC)    right hip   Depression    GERD (gastroesophageal reflux disease)    Hyperlipidemia    Hypertension    LBBB (left bundle branch block)    Pre-diabetes    PVC (premature ventricular contraction)     Past Surgical History:  Procedure Laterality Date   KNEE ARTHROSCOPY     left   TOTAL HIP ARTHROPLASTY Right 04/01/2015   Procedure: RIGHT TOTAL HIP ARTHROPLASTY ANTERIOR APPROACH;  Surgeon: Kathryne Hitch, MD;  Location: [facility];  Service: Orthopedics;  Laterality: Right;    Prior to Admission medications   Medication Sig Start Date End Date Taking? Authorizing Provider  lisinopril (ZESTRIL) 40 MG tablet Take 1 tablet (40 mg total) by mouth daily. 10/16/22  Yes Mliss Sax, MD  Multiple Vitamin (MULTIVITAMIN) tablet Take 1 tablet by mouth daily.   Yes [provider]  Omega-3 Fatty Acids (FISH OIL) 300 MG CAPS Take by mouth.   Yes [provider]  simvastatin (ZOCOR) 10 MG tablet Take 1 tablet (10 mg total) by mouth at bedtime. 09/14/22  Yes Worthy Rancher B, FNP  triamcinolone (KENALOG) 0.025 % ointment May apply to rash on scrotum twice daily as needed. 09/03/22  Yes Mliss Sax, MD  Turmeric 07-998 MG CAPS Take by mouth.   Yes [provider]  albuterol (VENTOLIN HFA) 108 (90 Base) MCG/ACT inhaler Inhale 1 puff into the lungs every 6 (six) hours as needed for wheezing or shortness of breath. 09/03/22   Mliss Sax, MD    Current Outpatient Medications  Medication  Sig Dispense Refill   lisinopril (ZESTRIL) 40 MG tablet Take 1 tablet (40 mg total) by mouth daily. 90 tablet 3   Multiple Vitamin (MULTIVITAMIN) tablet Take 1 tablet by mouth daily.     Omega-3 Fatty Acids (FISH OIL) 300 MG CAPS Take by mouth.     simvastatin (ZOCOR) 10 MG tablet Take 1 tablet (10 mg total) by mouth at bedtime. 90 tablet 3   triamcinolone (KENALOG) 0.025 % ointment May apply to rash on scrotum twice daily as needed. 30 g 0   Turmeric 07-998 MG CAPS Take by mouth.     albuterol (VENTOLIN HFA) 108 (90 Base) MCG/ACT inhaler Inhale 1 puff into the lungs every 6 (six) hours as needed for wheezing or shortness of breath. 6.7 g 0   Current Facility-Administered Medications  Medication Dose Route Frequency Provider Last Rate Last Admin   0.9 %  sodium chloride infusion  500 mL Intravenous Once Jenel Lucks, MD        Allergies as of 12/12/2022 - Review Complete 12/12/2022  Allergen Reaction Noted   Omnicef [cefdinir] Hives 03/18/2015   Penicillins Hives 03/18/2015    Family History  Problem Relation Age of Onset   Bradycardia Father    Diverticulitis Brother    Heart attack Maternal Grandfather    Colon cancer Neg Hx    Colon polyps Neg Hx  Crohn's disease Neg Hx    Esophageal cancer Neg Hx    Rectal cancer Neg Hx    Stomach cancer Neg Hx    Ulcerative colitis Neg Hx     Social History   Socioeconomic History   Marital status: Married    Spouse name: Not on file   Number of children: 2   Years of education: Not on file   Highest education level: Not on file  Occupational History   Occupation: Radiology Technologist-[employer]  Tobacco Use   Smoking status: Never   Smokeless tobacco: Never  Vaping Use   Vaping status: Never Used  Substance and Sexual Activity   Alcohol use: No   Drug use: No   Sexual activity: Not on file  Other Topics Concern   Not on file  Social History Narrative   Not on file   Social Determinants of Health   Financial  Resource Strain: Not on file  Food Insecurity: Not on file  Transportation Needs: Not on file  Physical Activity: Not on file  Stress: Not on file  Social Connections: Not on file  Intimate Partner Violence: Not on file    Review of Systems:  All other review of systems negative except as mentioned in the HPI.  Physical Exam: Vital signs BP 120/67   Pulse 95   Temp (!) 96.6 F (35.9 C) (Temporal)   Ht 5\' 9"  (1.753 m)   Wt 224 lb (101.6 kg)   BMI 33.08 kg/m   General:   Alert,  Well-developed, well-nourished, pleasant and cooperative in NAD Airway:  Mallampati 1 Lungs:  Clear throughout to auscultation.   Heart:  Regular rate and rhythm; no murmurs, clicks, rubs,  or gallops. Abdomen:  Soft, nontender and nondistended. Normal bowel sounds.   Neuro/Psych:  Normal mood and affect. A and O x 3   Sameeha Rockefeller E. Tomasa Rand, MD [facility]

## 2022-12-12 NOTE — Op Note (Signed)
[facility] Patient Name: Jacob Faulkner Procedure Date: 12/12/2022 7:46 AM MRN: [id number] Endoscopist: Lorin Picket E. Tomasa Rand , MD, [id number] Age: 47 Referring MD:  Date of Birth: [birth year] Gender: Male Account #: [id number] Procedure:                Colonoscopy Indications:              Screening for colorectal malignant neoplasm, This                            is the patient's first colonoscopy Medicines:                Monitored Anesthesia Care Procedure:                Pre-Anesthesia Assessment:                           - Prior to the procedure, a History and Physical                            was performed, and patient medications and                            allergies were reviewed. The patient's tolerance of                            previous anesthesia was also reviewed. The risks                            and benefits of the procedure and the sedation                            options and risks were discussed with the patient.                            All questions were answered, and informed consent                            was obtained. Prior Anticoagulants: The patient has                            taken no anticoagulant or antiplatelet agents. ASA                            Grade Assessment: II - A patient with mild systemic                            disease. After reviewing the risks and benefits,                            the patient was deemed in satisfactory condition to                            undergo the procedure.  After obtaining informed consent, the colonoscope                            was passed under direct vision. Throughout the                            procedure, the patient's blood pressure, pulse, and                            oxygen saturations were monitored continuously. The                            CF [id number] #[id number] was introduced through the anus                            and advanced to the  the terminal ileum, with                            identification of the appendiceal orifice and IC                            valve. The colonoscopy was performed without                            difficulty. The patient tolerated the procedure                            well. The quality of the bowel preparation was                            good. The terminal ileum, ileocecal valve,                            appendiceal orifice, and rectum were photographed.                            The bowel preparation used was SUPREP via split                            dose instruction. Scope In: 8:00:47 AM Scope Out: 8:19:42 AM Scope Withdrawal Time: 0 hours 15 minutes 57 seconds  Total Procedure Duration: 0 hours 18 minutes 55 seconds  Findings:                 The perianal and digital rectal examinations were                            normal. Pertinent negatives include normal                            sphincter tone and no palpable rectal lesions.                           A 5 mm polyp was found in the ascending colon. The  polyp was sessile. The polyp was removed with a                            cold snare. Resection and retrieval were complete.                            Estimated blood loss was minimal.                           An 8 mm polyp was found in the sigmoid colon. The                            polyp was pedunculated. The polyp was removed with                            a cold snare. Resection and retrieval were                            complete. Estimated blood loss was minimal.                           A 5 mm polyp was found in the rectum. The polyp was                            sessile. The polyp was removed with a cold snare.                            Resection and retrieval were complete. Estimated                            blood loss was minimal.                           A few small-mouthed diverticula were found in the                             sigmoid colon.                           The exam was otherwise normal throughout the                            examined colon.                           The terminal ileum appeared normal.                           The retroflexed view of the distal rectum and anal                            verge was normal and showed no anal or rectal  abnormalities. Complications:            No immediate complications. Estimated Blood Loss:     Estimated blood loss was minimal. Impression:               - One 5 mm polyp in the ascending colon, removed                            with a cold snare. Resected and retrieved.                           - One 8 mm polyp in the sigmoid colon, removed with                            a cold snare. Resected and retrieved.                           - One 5 mm polyp in the rectum, removed with a cold                            snare. Resected and retrieved.                           - Mild diverticulosis in the sigmoid colon.                           - The examined portion of the ileum was normal.                           - The distal rectum and anal verge are normal on                            retroflexion view. Recommendation:           - Patient has a contact number available for                            emergencies. The signs and symptoms of potential                            delayed complications were discussed with the                            patient. Return to normal activities tomorrow.                            Written discharge instructions were provided to the                            patient.                           - Resume previous diet.                           - Continue present medications.                           -  Await pathology results.                           - Repeat colonoscopy (date not yet determined) for                            surveillance based on pathology  results. Glenys Snader E. Tomasa Rand, MD 12/12/2022 8:32:20 AM This report has been signed electronically.

## 2022-12-12 NOTE — Progress Notes (Signed)
Called to room to assist during endoscopic procedure.  Patient ID and intended procedure confirmed with present staff. Received instructions for my participation in the procedure from the performing physician.  

## 2022-12-12 NOTE — Progress Notes (Signed)
Pt denies any new medical changes since last pre-visit.

## 2022-12-12 NOTE — Progress Notes (Signed)
Patient used his own personal albuterol prior to procedure.   Uneventful anesthetic. Report to pacu rn. Vss. Care resumed by rn.

## 2022-12-13 ENCOUNTER — Telehealth: Payer: Self-pay | Admitting: *Deleted

## 2022-12-13 NOTE — Telephone Encounter (Signed)
No answer on  follow up call. Left message.   

## 2022-12-14 ENCOUNTER — Other Ambulatory Visit (HOSPITAL_COMMUNITY): Payer: Self-pay

## 2022-12-14 ENCOUNTER — Encounter: Payer: Self-pay | Admitting: Family Medicine

## 2022-12-14 ENCOUNTER — Ambulatory Visit: Payer: 59 | Admitting: Family Medicine

## 2022-12-14 VITALS — BP 118/80 | HR 64 | Temp 97.4°F | Ht 69.0 in | Wt 232.2 lb

## 2022-12-14 DIAGNOSIS — E7800 Pure hypercholesterolemia, unspecified: Secondary | ICD-10-CM | POA: Diagnosis not present

## 2022-12-14 DIAGNOSIS — I10 Essential (primary) hypertension: Secondary | ICD-10-CM

## 2022-12-14 LAB — SURGICAL PATHOLOGY

## 2022-12-14 MED ORDER — LISINOPRIL 40 MG PO TABS
40.0000 mg | ORAL_TABLET | Freq: Every day | ORAL | 3 refills | Status: DC
  Filled 2022-12-14 – 2023-02-17 (×2): qty 90, 90d supply, fill #0
  Filled 2023-05-28: qty 90, 90d supply, fill #1
  Filled 2023-08-22: qty 90, 90d supply, fill #2
  Filled 2023-12-02: qty 90, 90d supply, fill #3

## 2022-12-14 MED ORDER — SIMVASTATIN 10 MG PO TABS
10.0000 mg | ORAL_TABLET | Freq: Every day | ORAL | 3 refills | Status: DC
  Filled 2022-12-14 – 2023-01-01 (×2): qty 90, 90d supply, fill #0
  Filled 2023-04-08: qty 90, 90d supply, fill #1
  Filled 2023-07-23: qty 90, 90d supply, fill #2
  Filled 2023-10-20: qty 90, 90d supply, fill #3

## 2022-12-14 NOTE — Progress Notes (Signed)
Established Patient Office Visit   Subjective:  Patient ID: Jacob Faulkner, male    DOB: [birth year]  Age: 47 y.o. MRN: [id number]  Chief Complaint  Patient presents with   Follow-up    8 week follow up hypertension    HPI Encounter Diagnoses  Name Primary?   Essential hypertension Yes   Elevated LDL cholesterol level    For follow-up of above.  Left bundle branch block resolved this visit with cardiologist.  Calcium score was 0.  Blood pressure well-controlled with lisinopril 40 mg daily.  No cough.  Dizziness has resolved with the Epley maneuver   Review of Systems  Constitutional: Negative.   HENT: Negative.    Eyes:  Negative for blurred vision, discharge and redness.  Respiratory: Negative.    Cardiovascular: Negative.   Gastrointestinal:  Negative for abdominal pain.  Genitourinary: Negative.   Musculoskeletal: Negative.  Negative for myalgias.  Skin:  Negative for rash.  Neurological:  Negative for tingling, loss of consciousness and weakness.  Endo/Heme/Allergies:  Negative for polydipsia.     Current Outpatient Medications:    albuterol (VENTOLIN HFA) 108 (90 Base) MCG/ACT inhaler, Inhale 1 puff into the lungs every 6 (six) hours as needed for wheezing or shortness of breath., Disp: 6.7 g, Rfl: 0   Multiple Vitamin (MULTIVITAMIN) tablet, Take 1 tablet by mouth daily., Disp: , Rfl:    Omega-3 Fatty Acids (FISH OIL) 300 MG CAPS, Take by mouth., Disp: , Rfl:    triamcinolone (KENALOG) 0.025 % ointment, May apply to rash on scrotum twice daily as needed., Disp: 30 g, Rfl: 0   lisinopril (ZESTRIL) 40 MG tablet, Take 1 tablet (40 mg total) by mouth daily., Disp: 90 tablet, Rfl: 3   simvastatin (ZOCOR) 10 MG tablet, Take 1 tablet (10 mg total) by mouth at bedtime., Disp: 90 tablet, Rfl: 3   Turmeric 07-998 MG CAPS, Take by mouth. (Patient not taking: Reported on 12/14/2022), Disp: , Rfl:    Objective:     BP 118/80   Pulse 64   Temp (!) 97.4 F (36.3 C) (Temporal)    Ht 5\' 9"  (1.753 m)   Wt 232 lb 3.2 oz (105.3 kg)   SpO2 98%   BMI 34.29 kg/m    Physical Exam Constitutional:      General: He is not in acute distress.    Appearance: Normal appearance. He is not ill-appearing, toxic-appearing or diaphoretic.  HENT:     Head: Normocephalic and atraumatic.     Right Ear: External ear normal.     Left Ear: External ear normal.  Eyes:     General: No scleral icterus.       Right eye: No discharge.        Left eye: No discharge.     Extraocular Movements: Extraocular movements intact.     Conjunctiva/sclera: Conjunctivae normal.  Pulmonary:     Effort: Pulmonary effort is normal. No respiratory distress.  Skin:    General: Skin is warm and dry.  Neurological:     Mental Status: He is alert and oriented to person, place, and time.  Psychiatric:        Mood and Affect: Mood normal.        Behavior: Behavior normal.      No results found for any visits on 12/14/22.    The 10-year ASCVD risk score (Arnett DK, et al., 2019) is: 2.9%    Assessment & Plan:   Essential hypertension -  Lisinopril; Take 1 tablet (40 mg total) by mouth daily.  Dispense: 90 tablet; Refill: 3  Elevated LDL cholesterol level -     Simvastatin; Take 1 tablet (10 mg total) by mouth at bedtime.  Dispense: 90 tablet; Refill: 3    Return in about 1 year (around 12/14/2023), or Continue with lisinopril and simvastatin..  Would like to continue with simvastatin for another year.  Could consider discontinuation low coronary artery calcium score  Mliss Sax, MD

## 2022-12-15 NOTE — Progress Notes (Signed)
Jacob Faulkner,   The three polyps that I removed during your recent procedure were completely benign but were proven to be "pre-cancerous" polyps that MAY have grown into cancers if they had not been removed.  Studies shows that at least 20% of women over age 46 and 30% of men over age 20 have pre-cancerous polyps.  Based on current nationally recognized surveillance guidelines, I recommend that you have a repeat colonoscopy in 5 years.   If you develop any new rectal bleeding, abdominal pain or significant bowel habit changes, please contact me before then.

## 2022-12-26 ENCOUNTER — Other Ambulatory Visit (HOSPITAL_COMMUNITY): Payer: Self-pay

## 2023-01-01 ENCOUNTER — Other Ambulatory Visit (HOSPITAL_COMMUNITY): Payer: Self-pay

## 2023-02-18 ENCOUNTER — Other Ambulatory Visit: Payer: Self-pay

## 2023-02-18 ENCOUNTER — Other Ambulatory Visit (HOSPITAL_COMMUNITY): Payer: Self-pay

## 2023-08-08 DIAGNOSIS — M79641 Pain in right hand: Secondary | ICD-10-CM | POA: Diagnosis not present

## 2023-08-08 DIAGNOSIS — M79642 Pain in left hand: Secondary | ICD-10-CM | POA: Diagnosis not present

## 2023-08-08 DIAGNOSIS — G5603 Carpal tunnel syndrome, bilateral upper limbs: Secondary | ICD-10-CM | POA: Diagnosis not present

## 2023-11-26 ENCOUNTER — Other Ambulatory Visit: Payer: Self-pay | Admitting: Family Medicine

## 2023-11-26 DIAGNOSIS — J4520 Mild intermittent asthma, uncomplicated: Secondary | ICD-10-CM

## 2023-11-27 ENCOUNTER — Other Ambulatory Visit (HOSPITAL_COMMUNITY): Payer: Self-pay

## 2023-11-27 MED ORDER — ALBUTEROL SULFATE HFA 108 (90 BASE) MCG/ACT IN AERS
1.0000 | INHALATION_SPRAY | Freq: Four times a day (QID) | RESPIRATORY_TRACT | 0 refills | Status: AC | PRN
  Filled 2023-11-27: qty 6.7, 50d supply, fill #0

## 2023-12-26 ENCOUNTER — Other Ambulatory Visit (HOSPITAL_COMMUNITY): Payer: Self-pay

## 2023-12-26 ENCOUNTER — Encounter: Payer: Self-pay | Admitting: Family Medicine

## 2023-12-26 ENCOUNTER — Ambulatory Visit: Payer: 59 | Admitting: Family Medicine

## 2023-12-26 ENCOUNTER — Ambulatory Visit: Payer: Self-pay | Admitting: Family Medicine

## 2023-12-26 VITALS — BP 128/82 | HR 72 | Temp 95.4°F | Ht 69.0 in | Wt 246.6 lb

## 2023-12-26 DIAGNOSIS — Z0001 Encounter for general adult medical examination with abnormal findings: Secondary | ICD-10-CM

## 2023-12-26 DIAGNOSIS — E7800 Pure hypercholesterolemia, unspecified: Secondary | ICD-10-CM

## 2023-12-26 DIAGNOSIS — Z131 Encounter for screening for diabetes mellitus: Secondary | ICD-10-CM

## 2023-12-26 DIAGNOSIS — R058 Other specified cough: Secondary | ICD-10-CM

## 2023-12-26 DIAGNOSIS — I10 Essential (primary) hypertension: Secondary | ICD-10-CM | POA: Diagnosis not present

## 2023-12-26 DIAGNOSIS — Z0000 Encounter for general adult medical examination without abnormal findings: Secondary | ICD-10-CM

## 2023-12-26 DIAGNOSIS — Z23 Encounter for immunization: Secondary | ICD-10-CM | POA: Diagnosis not present

## 2023-12-26 LAB — LIPID PANEL
Cholesterol: 185 mg/dL (ref 0–200)
HDL: 58.9 mg/dL (ref >39.00)
LDL Cholesterol: 98 mg/dL (ref 0–99)
NonHDL: 125.95
Total CHOL/HDL Ratio: 3
Triglycerides: 141 mg/dL (ref 0.0–149.0)
VLDL: 28.2 mg/dL (ref 0.0–40.0)

## 2023-12-26 LAB — CBC WITH DIFFERENTIAL/PLATELET
Basophils Absolute: 0.1 K/uL (ref 0.0–0.1)
Basophils Relative: 0.8 % (ref 0.0–3.0)
Eosinophils Absolute: 0.3 K/uL (ref 0.0–0.7)
Eosinophils Relative: 4.4 % (ref 0.0–5.0)
HCT: 46.9 % (ref 39.0–52.0)
Hemoglobin: 15.6 g/dL (ref 13.0–17.0)
Lymphocytes Relative: 34.3 % (ref 12.0–46.0)
Lymphs Abs: 2.2 K/uL (ref 0.7–4.0)
MCHC: 33.3 g/dL (ref 30.0–36.0)
MCV: 86.7 fl (ref 78.0–100.0)
Monocytes Absolute: 0.6 K/uL (ref 0.1–1.0)
Monocytes Relative: 8.7 % (ref 3.0–12.0)
Neutro Abs: 3.4 K/uL (ref 1.4–7.7)
Neutrophils Relative %: 51.8 % (ref 43.0–77.0)
Platelets: 183 K/uL (ref 150.0–400.0)
RBC: 5.4 Mil/uL (ref 4.22–5.81)
RDW: 13.7 % (ref 11.5–15.5)
WBC: 6.5 K/uL (ref 4.0–10.5)

## 2023-12-26 LAB — URINALYSIS, ROUTINE W REFLEX MICROSCOPIC
Bilirubin Urine: NEGATIVE
Hgb urine dipstick: NEGATIVE
Ketones, ur: NEGATIVE
Leukocytes,Ua: NEGATIVE
Nitrite: NEGATIVE
RBC / HPF: NONE SEEN (ref 0–?)
Specific Gravity, Urine: 1.025 (ref 1.000–1.030)
Total Protein, Urine: NEGATIVE
Urine Glucose: NEGATIVE
Urobilinogen, UA: 0.2 (ref 0.0–1.0)
pH: 5.5 (ref 5.0–8.0)

## 2023-12-26 LAB — COMPREHENSIVE METABOLIC PANEL WITH GFR
ALT: 26 U/L (ref 0–53)
AST: 21 U/L (ref 0–37)
Albumin: 4.7 g/dL (ref 3.5–5.2)
Alkaline Phosphatase: 57 U/L (ref 39–117)
BUN: 15 mg/dL (ref 6–23)
CO2: 28 meq/L (ref 19–32)
Calcium: 9.2 mg/dL (ref 8.4–10.5)
Chloride: 101 meq/L (ref 96–112)
Creatinine, Ser: 0.91 mg/dL (ref 0.40–1.50)
GFR: 100.48 mL/min (ref >60.00)
Glucose, Bld: 89 mg/dL (ref 70–99)
Potassium: 4.9 meq/L (ref 3.5–5.1)
Sodium: 137 meq/L (ref 135–145)
Total Bilirubin: 1.1 mg/dL (ref 0.2–1.2)
Total Protein: 7.1 g/dL (ref 6.0–8.3)

## 2023-12-26 LAB — HEMOGLOBIN A1C: Hgb A1c MFr Bld: 6 % (ref 4.6–6.5)

## 2023-12-26 MED ORDER — PREDNISONE 10 MG PO TABS
10.0000 mg | ORAL_TABLET | Freq: Two times a day (BID) | ORAL | 0 refills | Status: AC
  Filled 2023-12-26: qty 14, 7d supply, fill #0

## 2023-12-26 NOTE — Progress Notes (Addendum)
 Established Patient Office Visit   Subjective:  Patient ID: Jacob Faulkner, male    DOB: 11/20/1975  Age: 48 y.o. MRN: [id number]  Chief Complaint  Patient presents with   Annual Exam    Pt present for CPE, pt has been fasting. No specific question or concerns.     HPI Encounter Diagnoses  Name Primary?   Healthcare maintenance Yes   Essential hypertension    Elevated LDL cholesterol level    Post-viral cough syndrome    Immunization due    Screening for diabetes mellitus    For physical and follow-up of above.  Does have regular dental care.  No regular exercise over the last several months associated with increased weight.  Would like to continue simvastatin  with elevated LDL despite low calcium score.  Status post recent URI after flu shot with persisting dry cough.  History of mycoplasma.   Review of Systems  Constitutional: Negative.   HENT: Negative.    Eyes:  Negative for blurred vision, discharge and redness.  Respiratory:  Positive for cough and wheezing. Negative for sputum production and shortness of breath.   Cardiovascular: Negative.   Gastrointestinal:  Negative for abdominal pain.  Genitourinary: Negative.   Musculoskeletal: Negative.  Negative for myalgias.  Skin:  Negative for rash.  Neurological:  Negative for tingling, loss of consciousness and weakness.  Endo/Heme/Allergies:  Negative for polydipsia.      12/26/2023    8:26 AM 12/14/2022    8:57 AM 09/03/2022    3:41 PM  Depression screen PHQ 2/9  Decreased Interest 0 0 1  Down, Depressed, Hopeless 0 0 1  PHQ - 2 Score 0 0 2  Altered sleeping   0  Tired, decreased energy   0  Change in appetite   0  Feeling bad or failure about yourself    1  Trouble concentrating   0  Moving slowly or fidgety/restless   0  Suicidal thoughts   0  PHQ-9 Score   3  Difficult doing work/chores   Not difficult at all       Current Outpatient Medications:    albuterol  (VENTOLIN  HFA) 108 (90 Base) MCG/ACT  inhaler, Inhale 1 puff into the lungs every 6 (six) hours as needed for wheezing or shortness of breath., Disp: 6.7 g, Rfl: 0   Cholecalciferol (D3 2000) 50 MCG (2000 UT) CAPS, , Disp: , Rfl:    lisinopril  (ZESTRIL ) 40 MG tablet, Take 1 tablet (40 mg total) by mouth daily., Disp: 90 tablet, Rfl: 3   Multiple Vitamin (MULTIVITAMIN) tablet, Take 1 tablet by mouth daily., Disp: , Rfl:    Omega-3 Fatty Acids (FISH OIL) 300 MG CAPS, Take by mouth., Disp: , Rfl:    predniSONE  (DELTASONE ) 10 MG tablet, Take 1 tablet (10 mg total) by mouth 2 (two) times daily with a meal for 7 days., Disp: 14 tablet, Rfl: 0   simvastatin  (ZOCOR ) 10 MG tablet, Take 1 tablet (10 mg total) by mouth at bedtime., Disp: 90 tablet, Rfl: 3   triamcinolone  (KENALOG ) 0.025 % ointment, May apply to rash on scrotum twice daily as needed., Disp: 30 g, Rfl: 0   Turmeric 07-998 MG CAPS, Take by mouth., Disp: , Rfl:    Pyridoxine HCl (B-6) 100 MG TABS, , Disp: , Rfl:    Objective:     BP 128/82   Pulse 72   Temp (!) 95.4 F (35.2 C)   Ht 5' 9 (1.753 m)   Wt 246  lb 9.6 oz (111.9 kg)   SpO2 98%   BMI 36.42 kg/m  BP Readings from Last 3 Encounters:  12/26/23 128/82  12/14/22 118/80  12/12/22 (!) 84/51   Wt Readings from Last 3 Encounters:  12/26/23 246 lb 9.6 oz (111.9 kg)  12/14/22 232 lb 3.2 oz (105.3 kg)  12/12/22 224 lb (101.6 kg)      Physical Exam Constitutional:      General: He is not in acute distress.    Appearance: Normal appearance. He is not ill-appearing, toxic-appearing or diaphoretic.  HENT:     Head: Normocephalic and atraumatic.     Right Ear: Tympanic membrane, ear canal and external ear normal.     Left Ear: Tympanic membrane, ear canal and external ear normal.     Mouth/Throat:     Mouth: Mucous membranes are moist.     Pharynx: Oropharynx is clear. No oropharyngeal exudate or posterior oropharyngeal erythema.  Eyes:     General: No scleral icterus.       Right eye: No discharge.         Left eye: No discharge.     Extraocular Movements: Extraocular movements intact.     Conjunctiva/sclera: Conjunctivae normal.     Pupils: Pupils are equal, round, and reactive to light.  Cardiovascular:     Rate and Rhythm: Normal rate and regular rhythm.  Pulmonary:     Effort: Pulmonary effort is normal. No respiratory distress.     Breath sounds: Normal breath sounds.  Abdominal:     General: Bowel sounds are normal.     Tenderness: There is no abdominal tenderness. There is no guarding or rebound.     Hernia: A hernia (Reducible umbilical) is present. There is no hernia in the left inguinal area or right inguinal area.  Genitourinary:    Penis: Circumcised. No hypospadias, erythema, tenderness, discharge, swelling or lesions.      Testes: Normal.        Right: Mass, tenderness or swelling not present. Right testis is descended.        Left: Mass, tenderness or swelling not present. Left testis is descended.     Epididymis:     Right: Not inflamed or enlarged.     Left: Not inflamed or enlarged.  Musculoskeletal:     Cervical back: No rigidity or tenderness.  Lymphadenopathy:     Lower Body: No right inguinal adenopathy. No left inguinal adenopathy.  Skin:    General: Skin is warm and dry.  Neurological:     Mental Status: He is alert and oriented to person, place, and time.  Psychiatric:        Mood and Affect: Mood normal.        Behavior: Behavior normal.      No results found for any visits on 12/26/23.    The 10-year ASCVD risk score (Arnett DK, et al., 2019) is: 3.7%    Assessment & Plan:   Healthcare maintenance  Essential hypertension -     CBC with Differential/Platelet -     Comprehensive metabolic panel with GFR -     Urinalysis, Routine w reflex microscopic  Elevated LDL cholesterol level -     Comprehensive metabolic panel with GFR -     Lipid panel  Post-viral cough syndrome -     predniSONE ; Take 1 tablet (10 mg total) by mouth 2 (two) times  daily with a meal for 7 days.  Dispense: 14 tablet; Refill: 0  Immunization  due -     Pneumococcal conjugate vaccine 20-valent -     Heplisav-B (HepB-CPG) Vaccine  Screening for diabetes mellitus -     Comprehensive metabolic panel with GFR -     Hemoglobin A1c    Return in about 3 months (around 03/27/2024), or Check and record BPs.  Exercise and lose weight..  BP elevated today with diastolic pressure of 92.  Came down to 82 on recheck.  He will check and record his blood pressures over the next 3 months.  Information given on exercising to lose weight and managing hypertension.  Continue simvastatin .  Prednisone  10 twice daily for post fall viral cough syndrome.  Start hep B series  Elsie Sim Lent, MD  Addendum: Uric acid crystals seen in urine.  Need to check uric acid at next visit.

## 2023-12-28 ENCOUNTER — Encounter: Payer: Self-pay | Admitting: Family Medicine

## 2024-01-05 ENCOUNTER — Other Ambulatory Visit: Payer: Self-pay | Admitting: Medical Genetics

## 2024-01-05 DIAGNOSIS — Z006 Encounter for examination for normal comparison and control in clinical research program: Secondary | ICD-10-CM

## 2024-01-26 ENCOUNTER — Other Ambulatory Visit: Payer: Self-pay | Admitting: Family Medicine

## 2024-01-26 DIAGNOSIS — E7800 Pure hypercholesterolemia, unspecified: Secondary | ICD-10-CM

## 2024-01-27 ENCOUNTER — Other Ambulatory Visit (HOSPITAL_COMMUNITY): Payer: Self-pay

## 2024-01-27 MED ORDER — SIMVASTATIN 10 MG PO TABS
10.0000 mg | ORAL_TABLET | Freq: Every day | ORAL | 3 refills | Status: AC
  Filled 2024-01-27: qty 90, 90d supply, fill #0

## 2024-02-26 ENCOUNTER — Ambulatory Visit

## 2024-02-27 ENCOUNTER — Other Ambulatory Visit: Payer: Self-pay | Admitting: Family Medicine

## 2024-02-27 DIAGNOSIS — I10 Essential (primary) hypertension: Secondary | ICD-10-CM

## 2024-03-02 ENCOUNTER — Other Ambulatory Visit (HOSPITAL_COMMUNITY): Payer: Self-pay

## 2024-03-02 ENCOUNTER — Other Ambulatory Visit: Payer: Self-pay | Admitting: Family Medicine

## 2024-03-02 DIAGNOSIS — I10 Essential (primary) hypertension: Secondary | ICD-10-CM

## 2024-03-04 ENCOUNTER — Other Ambulatory Visit (HOSPITAL_COMMUNITY): Payer: Self-pay

## 2024-03-04 MED ORDER — LISINOPRIL 40 MG PO TABS
40.0000 mg | ORAL_TABLET | Freq: Every day | ORAL | 3 refills | Status: AC
  Filled 2024-03-04: qty 90, 90d supply, fill #0

## 2024-03-24 ENCOUNTER — Ambulatory Visit: Admitting: Family Medicine
